# Patient Record
Sex: Female | Born: 1975 | Race: Black or African American | Hispanic: No | Marital: Married | Smoking: Never smoker
Health system: Southern US, Community
[De-identification: ages and names within clinical notes are randomized; demographics above are authoritative.]

## PROBLEM LIST (undated history)

## (undated) DIAGNOSIS — K0889 Other specified disorders of teeth and supporting structures: Secondary | ICD-10-CM

## (undated) DIAGNOSIS — I10 Essential (primary) hypertension: Secondary | ICD-10-CM

## (undated) DIAGNOSIS — D649 Anemia, unspecified: Secondary | ICD-10-CM

## (undated) DIAGNOSIS — Z8042 Family history of malignant neoplasm of prostate: Secondary | ICD-10-CM

## (undated) DIAGNOSIS — N390 Urinary tract infection, site not specified: Secondary | ICD-10-CM

## (undated) DIAGNOSIS — Z800 Family history of malignant neoplasm of digestive organs: Secondary | ICD-10-CM

## (undated) DIAGNOSIS — E785 Hyperlipidemia, unspecified: Secondary | ICD-10-CM

## (undated) HISTORY — PX: TUBAL LIGATION: SHX77

## (undated) HISTORY — DX: Hyperlipidemia, unspecified: E78.5

## (undated) HISTORY — DX: Essential (primary) hypertension: I10

## (undated) HISTORY — DX: Family history of malignant neoplasm of prostate: Z80.42

## (undated) HISTORY — DX: Anemia, unspecified: D64.9

## (undated) HISTORY — DX: Family history of malignant neoplasm of digestive organs: Z80.0

---

## 2004-12-04 ENCOUNTER — Ambulatory Visit (HOSPITAL_COMMUNITY): Admission: RE | Admit: 2004-12-04 | Discharge: 2004-12-04 | Payer: Self-pay | Admitting: *Deleted

## 2004-12-27 ENCOUNTER — Ambulatory Visit: Payer: Self-pay | Admitting: Obstetrics and Gynecology

## 2004-12-27 ENCOUNTER — Inpatient Hospital Stay (HOSPITAL_COMMUNITY): Admission: AD | Admit: 2004-12-27 | Discharge: 2004-12-27 | Payer: Self-pay | Admitting: Obstetrics & Gynecology

## 2005-01-21 ENCOUNTER — Ambulatory Visit (HOSPITAL_COMMUNITY): Admission: RE | Admit: 2005-01-21 | Discharge: 2005-01-21 | Payer: Self-pay | Admitting: *Deleted

## 2005-02-28 ENCOUNTER — Ambulatory Visit (HOSPITAL_COMMUNITY): Admission: RE | Admit: 2005-02-28 | Discharge: 2005-02-28 | Payer: Self-pay | Admitting: *Deleted

## 2005-03-28 ENCOUNTER — Inpatient Hospital Stay (HOSPITAL_COMMUNITY): Admission: RE | Admit: 2005-03-28 | Discharge: 2005-03-31 | Payer: Self-pay | Admitting: *Deleted

## 2005-03-28 ENCOUNTER — Encounter (INDEPENDENT_AMBULATORY_CARE_PROVIDER_SITE_OTHER): Payer: Self-pay | Admitting: Specialist

## 2005-03-28 ENCOUNTER — Ambulatory Visit: Payer: Self-pay | Admitting: *Deleted

## 2006-04-17 ENCOUNTER — Emergency Department (HOSPITAL_COMMUNITY): Admission: EM | Admit: 2006-04-17 | Discharge: 2006-04-17 | Payer: Self-pay | Admitting: Emergency Medicine

## 2006-10-07 ENCOUNTER — Emergency Department (HOSPITAL_COMMUNITY): Admission: EM | Admit: 2006-10-07 | Discharge: 2006-10-07 | Payer: Self-pay | Admitting: Emergency Medicine

## 2008-08-07 ENCOUNTER — Emergency Department (HOSPITAL_COMMUNITY): Admission: EM | Admit: 2008-08-07 | Discharge: 2008-08-07 | Payer: Self-pay | Admitting: Emergency Medicine

## 2009-01-15 ENCOUNTER — Emergency Department (HOSPITAL_COMMUNITY): Admission: EM | Admit: 2009-01-15 | Discharge: 2009-01-16 | Payer: Self-pay | Admitting: Emergency Medicine

## 2010-06-12 LAB — URINALYSIS, ROUTINE W REFLEX MICROSCOPIC
Bilirubin Urine: NEGATIVE
Glucose, UA: NEGATIVE mg/dL
Hgb urine dipstick: NEGATIVE
Ketones, ur: NEGATIVE mg/dL
Nitrite: NEGATIVE
Protein, ur: NEGATIVE mg/dL
Specific Gravity, Urine: 1.031 — ABNORMAL HIGH (ref 1.005–1.030)
Urobilinogen, UA: 1 mg/dL (ref 0.0–1.0)
pH: 6 (ref 5.0–8.0)

## 2010-06-12 LAB — POCT PREGNANCY, URINE: Preg Test, Ur: NEGATIVE

## 2010-07-26 NOTE — Discharge Summary (Signed)
NAME:  Jenna Yoder, Jenna Yoder           ACCOUNT NO.:  [id number]   MEDICAL RECORD NO.:  [id number]          PATIENT TYPE:  INP   LOCATION:  9124                          FACILITY:  [facility]   PHYSICIAN:  Angeline Slim, M.D.  DATE OF BIRTH:  Jan 01, 1976   DATE OF ADMISSION:  03/28/2005  DATE OF DISCHARGE:  03/31/2005                                 DISCHARGE SUMMARY   The patient's hospital medial team was the [facility] Teaching Service.   DISCHARGE DIAGNOSES:  1.  Repeat low-transverse C-section.  2.  Bilateral tubal ligation.  3.  Term pregnancy with history of three previous C-sections.   SUMMARY OF LABORATORY VALUES:  RPR nonreactive.  Hemoglobin was 10.1 on  admission and at discharge was 7.6.  Urinalysis was negative for protein.  Specific gravity 1.015, negative nitrites and a questionable trace leukocyte  esterase.   SUMMARY OF HOSPITAL COURSE:  This is a 35 year old female, who came in on  March 28, 2005 at 99 and 6 weeks of gestation with an unremarkable  prenatal course for a scheduled C-section and has a history of three  previous C-sections.  This was a repeat C-section.  Operation performed by  Dr. Mayford Knife and Dr. Irving Burton under supervision of Dr. Gavin Potters.  Please see  their operation note for full details of C-section.   Postoperatively, the patient did well.  Desired that her female child get a  circumcision, which he did.  Gradually increased diet, increased ambulation.  The patient was rubella nonimmune and has gotten a rubella booster prior to  discharge.  The patient was significantly anemic with a hemoglobin of 7.4  postoperatively; however, was not symptomatic, and will start iron  supplementation before discharge and to continue at home, and patient was  feeding infant by breast and bottle feeding both.   Follow up care will be with [facility] in approximately six weeks.   DISCHARGE MEDICATIONS:  1.  Colace 100 mg b.i.d.  2.  Percocet 5/325 mg one tablet to be  taken q.4h. p.r.n. breakthrough pain.  3.  Ibuprofen 600 mg p.o. q.6h. p.r.n. pain.  4.  Ferrous sulfate 325 mg b.i.d. and between meals p.o.   DISCHARGE ACTIVITY:  Increase as tolerated.   DIET:  Increase as tolerated.   The patient is discharge to home tolerating diet, passing gas and able to  ambulate without difficulty.      Angeline Slim, M.D.     AL/MEDQ  D:  03/31/2005  T:  03/31/2005  Job:  [id number]

## 2010-07-26 NOTE — Op Note (Signed)
Jenna Yoder, Jenna Yoder           ACCOUNT NO.:  [id number]   MEDICAL RECORD NO.:  [id number]          PATIENT TYPE:  INP   LOCATION:  9124                          [facility]   PHYSICIAN:  Conni Elliot, M.D.DATE OF BIRTH:  03/26/1975   DATE OF PROCEDURE:  03/31/2005  DATE OF DISCHARGE:                                 OPERATIVE REPORT   PREOPERATIVE DIAGNOSES:  1.  Intrauterine pregnancy at 38 weeks and 6 days.  2.  History of three previous cesarean sections.  3.  Desires permanent sterilization.   POSTOPERATIVE DIAGNOSES:  1.  Intrauterine pregnancy at 38 weeks and 6 days.  2.  History of three previous cesarean sections.  3.  Desires permanent sterilization.   OPERATION/PROCEDURE:  1.  Repeat low transverse cesarean section via Pfannenstiel.  2.  Bilateral tubal ligation.   SURGEON:  Conni Elliot, M.D.   ASSISTANT:  1.  Tracy L. Mayford Knife, M.D.  2.  Angeline Slim, M.D.   ANESTHESIA:  Spinal.   COMPLICATIONS:  None.   ESTIMATED BLOOD LOSS:  800.   URINARY OUTPUT:  Clear urine at the end of the procedure.   INDICATIONS:  The patient is a 35 year old G4, para 3-0-0-3 at 38 weeks and  6 days who presents for scheduled repeat cesarean section and bilateral  tubal ligation.  The patient was counseled on the risks and benefits and  wanted to proceed.   FINDINGS:  Female infant in the cephalic presentation.  Apgars 9 and 9.  Normal uterus, tubes and ovaries except there was a significant amount of  scarring.  Her uterus was rotated to the left.   DESCRIPTION OF PROCEDURE:  The patient was taken to the operating room where  spinal anesthesia was placed and found to be adequate.  She was then prepped  and draped in the normal sterile fashion in the dorsal supine position with  a leftward tilt.  A Pfannenstiel skin incision was then made with the  scalpel and carried through to the underlying layer of fascia.  The fascia  was incised nicked in the midline and  the incision extended laterally with  the Mayo scissors.  The superior aspect of the fascial incision was then  grasped with the Kocher clamps, elevated and the underlying muscles  dissected off bluntly using the Mayo scissors the Mayo scissors and the  Bovie.  There was a significant amount of scarring.  Attention was then  turned to the inferior aspect of the incision which in a similar fashion was  grasped, tented up with Kocher clamps and the rectus muscle dissected off  bluntly.  The rectus muscles were separated in the midline and the  peritoneum identified and entered bluntly.  The peritoneal incision was  extended superiorly and inferiorly.  Once again there was significant amount  of scarring. This was done with great care.  The rectus muscles were incised  using the Bovie to make additional room.  There was significant scarring  within the peritoneal cavity.  The uterus was rotated to the left.  The  vesicouterine peritoneum was identified, grasped with the pickups and  entered sharply with the Metzenbaum scissors.  The incision was extended  laterally and the bladder flap created digitally.  The bladder blade was  then reinserted and the lower uterine segment incised in the transverse  fashion with the scalpel.  The uterine incision was extended laterally with  bandage scissors.  The bladder blade was removed and the infant's head  delivered atraumatically.  The nose and mouth were suctioned with bulb  suctioned and the cord clamped and cut.  The infant was handed off to the  waiting neonatologist.  The placenta was removed manually.  The uterus was  not exteriorized, but it was cleared of all clots and debris.  The uterine  incision was repaired with #1 chromic in a running locked fashion.  A second  layer of the same suture was used to imbricate.   Next, attention was turned her fallopian tubes.  There was significant  amount of scarring especially on the right side, but the  right fallopian  tube was grasped with a Babcock and followed down to the fimbria.  Then  suture on an S-H needle was placed through the mesosalpinx and a portion of  the fallopian tube was ligated.  Next, the same suture was used to ligate  the fallopian tube just inferior to where it had been tied.  The knot  section of the right fallopian tube was excised.  Excellent hemostasis was  obtained and it was returned to the peritoneal cavity.  Attention was then  turned to the left side and the same thing was repeated.   Next, attention was then turned back to the uterine incision.  There was a  small amount of oozing so an area on the left side was oversewn with  chromic.  Finally excellent hemostasis was obtained.  The fascia was  reapproximated with 0 Vicryl in a running fashion.  The subcuticular layer  was also closed and finally the skin was closed with staples.   The patient tolerated the procedure well.  Sponge, lap and needle counts  were correct x2.  Ancef was given at cord clamp.  The patient was taken to  the recovery room in stable condition.     ______________________________  Marc Morgans Mayford Knife, M.D.    ______________________________  Conni Elliot, M.D.    TLW/MEDQ  D:  03/30/2005  T:  03/31/2005  Job:  [id number]

## 2010-09-01 ENCOUNTER — Emergency Department (HOSPITAL_COMMUNITY): Payer: Self-pay

## 2010-09-01 ENCOUNTER — Emergency Department (HOSPITAL_COMMUNITY)
Admission: EM | Admit: 2010-09-01 | Discharge: 2010-09-01 | Disposition: A | Discharge status: Home / Self Care | Payer: Self-pay | Attending: Emergency Medicine | Admitting: Emergency Medicine

## 2010-09-01 DIAGNOSIS — M25519 Pain in unspecified shoulder: Secondary | ICD-10-CM | POA: Insufficient documentation

## 2010-09-01 DIAGNOSIS — W108XXA Fall (on) (from) other stairs and steps, initial encounter: Secondary | ICD-10-CM | POA: Insufficient documentation

## 2010-09-01 DIAGNOSIS — S4350XA Sprain of unspecified acromioclavicular joint, initial encounter: Secondary | ICD-10-CM | POA: Insufficient documentation

## 2012-07-26 ENCOUNTER — Telehealth: Payer: Self-pay | Admitting: *Deleted

## 2012-07-26 NOTE — Telephone Encounter (Signed)
Patient was seen 03-22-12 (see pre EPIC chart) and PUS was ordered for enlarged uterus and DUB.  Patient then had abnormal pap and colpo was ordered.  Patient [facility] for colpo 05-05-12.  Multiple calls to patient (previous chart).  Calling as follow up before sending 30 day letter. LMTCB on cell number in old chart [phone] and work # [phone]. Home # in chart [phone] is out of service.

## 2012-09-02 NOTE — Telephone Encounter (Signed)
Patient has not returned multiple attempts to contact her for PUS and colpo. What type of letter do you want her to receive?

## 2012-09-03 NOTE — Telephone Encounter (Signed)
30 day f/u necessary or discharge letter

## 2012-09-06 ENCOUNTER — Telehealth: Payer: Self-pay | Admitting: Obstetrics and Gynecology

## 2012-09-06 DIAGNOSIS — N852 Hypertrophy of uterus: Secondary | ICD-10-CM

## 2012-09-06 DIAGNOSIS — R87810 Cervical high risk human papillomavirus (HPV) DNA test positive: Secondary | ICD-10-CM

## 2012-09-06 DIAGNOSIS — N938 Other specified abnormal uterine and vaginal bleeding: Secondary | ICD-10-CM

## 2012-09-06 NOTE — Telephone Encounter (Signed)
6/30 called cell # (she gave it as the preferred contact number) was unable to leave a message because her mailbox was full. Also tried the number listed as a home phone number, it rang several times then the call was immediately disconnected.   **Calls made by [employer].

## 2012-09-06 NOTE — Telephone Encounter (Signed)
Attempt to call patient Needs both colpo and PUS.  Only scheduled for PUS.  VM  Confirms "This is Jenna Yoder"  But unable to leave message due to voice mailbox full.

## 2012-09-06 NOTE — Telephone Encounter (Signed)
Patient states she is returning Sally's call re: ultrasound scheduling. No note in EPIC so routing to triage.

## 2012-09-06 NOTE — Telephone Encounter (Signed)
Patient called to schedule ultrasound appointment.  Appt scheduled for this Wednesday at 1230. Patient asking about OOP cost and I told her I would need to have someone in billing pick up call.  Patient hung up before they were able to pick up call. Carolynn  In billing/scheduling will call her back and also remind her about need to schedule colpo as well.

## 2012-09-07 NOTE — Telephone Encounter (Signed)
At this point, she is scheduled for her PUS tomm at 1230 and consult at 1pm.  She has not returned our calls regarding the need to schedule her colpo and her OOP costs. If she comes in for appt, she has a significant balance due.  Due to her non-compliance, a 30 day letter was written on 09-03-12 prior to her call but not mailed on 09-06-12  since she scheduled the appointment.

## 2012-09-07 NOTE — Telephone Encounter (Signed)
Letter written and to MD for signature, patient called in on 09-06-12 prior to letter being mailed and appt scheduled for 09-08-12.  See next telephone note.

## 2012-09-07 NOTE — Telephone Encounter (Signed)
I am not clear if the patient is coming in for her ultrasound tomorrow.  I will not be in the office all morning.  [employer]

## 2012-09-08 ENCOUNTER — Ambulatory Visit: Payer: Self-pay | Admitting: Obstetrics and Gynecology

## 2012-09-08 ENCOUNTER — Other Ambulatory Visit: Payer: Self-pay

## 2012-09-08 NOTE — Telephone Encounter (Signed)
Pt cancelled ultrasound appt for today because she can't get out of class. Would like nurse to call  And reschedule appt.

## 2012-09-08 NOTE — Telephone Encounter (Signed)
Jenna Yoder,  This message came through regarding this patient.  Conley Simmonds, MD

## 2012-09-23 NOTE — Telephone Encounter (Signed)
Call to patient to follow up on canceled appointments and 30 day letter.  VM confirms patient's first name. LMTCB.

## 2012-10-11 NOTE — Telephone Encounter (Signed)
30 day letter was mailed on 09-07-12. Patient had appointment on 09-08-12 which she canceled due to work and has not rescheduled or returned my call from 09-23-12.  Discharged from practice.

## 2014-03-05 ENCOUNTER — Encounter (HOSPITAL_COMMUNITY): Payer: Self-pay | Admitting: Emergency Medicine

## 2014-03-05 ENCOUNTER — Emergency Department (HOSPITAL_COMMUNITY)
Admission: EM | Admit: 2014-03-05 | Discharge: 2014-03-05 | Disposition: A | Discharge status: Home / Self Care | Payer: BC Managed Care – PPO | Attending: Emergency Medicine | Admitting: Emergency Medicine

## 2014-03-05 DIAGNOSIS — K088 Other specified disorders of teeth and supporting structures: Secondary | ICD-10-CM | POA: Insufficient documentation

## 2014-03-05 DIAGNOSIS — R220 Localized swelling, mass and lump, head: Secondary | ICD-10-CM

## 2014-03-05 DIAGNOSIS — K029 Dental caries, unspecified: Secondary | ICD-10-CM

## 2014-03-05 DIAGNOSIS — K0889 Other specified disorders of teeth and supporting structures: Secondary | ICD-10-CM

## 2014-03-05 MED ORDER — OXYCODONE-ACETAMINOPHEN 5-325 MG PO TABS
2.0000 | ORAL_TABLET | Freq: Once | ORAL | Status: AC
  Administered 2014-03-05: 2 via ORAL
  Filled 2014-03-05: qty 2

## 2014-03-05 MED ORDER — ONDANSETRON 4 MG PO TBDP
4.0000 mg | ORAL_TABLET | Freq: Once | ORAL | Status: AC
  Administered 2014-03-05: 4 mg via ORAL
  Filled 2014-03-05: qty 1

## 2014-03-05 MED ORDER — AMOXICILLIN 500 MG PO CAPS: 500.0000 mg | ORAL_CAPSULE | Freq: Three times a day (TID) | ORAL | Status: DC

## 2014-03-05 MED ORDER — OXYCODONE-ACETAMINOPHEN 5-325 MG PO TABS: 1.0000 | ORAL_TABLET | Freq: Four times a day (QID) | ORAL | Status: DC | PRN

## 2014-03-05 NOTE — ED Notes (Signed)
pts vital signs updated. Pt awaiting discharge paperwork at bedside.  

## 2014-03-05 NOTE — ED Provider Notes (Signed)
CSN: [id number]     Arrival date & time 03/05/14  1903 History   First MD Initiated Contact with Patient 03/05/14 2128     Chief Complaint  Patient presents with  . Dental Pain    The patient has swelling and redness to her left, lower cheek.  She says she has had an abscess on the same side and she got antibiotics and it went away.     (Consider location/radiation/quality/duration/timing/severity/associated sxs/prior Treatment) Patient is a 38 y.o. female presenting with tooth pain. The history is provided by the patient and medical records. No language interpreter was used.  Dental Pain Location:  Lower Lower teeth location:  17/LL 3rd molar Quality:  Aching and constant Severity:  Moderate Onset quality:  Gradual Duration:  3 days Timing:  Constant Progression:  Worsening Chronicity:  Recurrent Associated symptoms: no drooling and no fever   denies difficulty breathing or swallowing.    History reviewed. No pertinent past medical history. Past Surgical History  Procedure Laterality Date  . Cesarean section     History reviewed. No pertinent family history. History  Substance Use Topics  . Smoking status: Never Smoker   . Smokeless tobacco: Never Used  . Alcohol Use: Yes     Comment: occ   OB History    No data available     Review of Systems  Constitutional: Negative for fever and chills.  HENT: Positive for dental problem. Negative for drooling and trouble swallowing.   Respiratory: Negative for shortness of breath and stridor.   Gastrointestinal: Negative for nausea and vomiting.  Musculoskeletal: Negative for myalgias.      Allergies  Review of patient's allergies indicates no known allergies.  Home Medications   Prior to Admission medications   Medication Sig Start Date End Date Taking? Authorizing Provider  amoxicillin (AMOXIL) 500 MG capsule Take 1 capsule (500 mg total) by mouth 3 (three) times daily. 03/05/14   Arthor CaptainAbigail Dashon Mcintire, PA-C    oxyCODONE-acetaminophen (PERCOCET/ROXICET) 5-325 MG per tablet Take 1-2 tablets by mouth every 6 (six) hours as needed for severe pain. 03/05/14   Iesha Summerhill, PA-C   BP 130/84 mmHg  Pulse 72  Temp(Src) 97.7 F (36.5 C) (Oral)  Resp 18  Ht 5\' 7"  (1.702 m)  Wt 230 lb (104.327 kg)  BMI 36.01 kg/m2  SpO2 100%  LMP 02/16/2014 Physical Exam  Constitutional: She is oriented to person, place, and time. She appears well-developed and well-nourished. No distress.  HENT:  Head: Normocephalic and atraumatic.  Mouth/Throat: Uvula is midline and oropharynx is clear and moist. Dental caries present. No dental abscesses.    Eyes: Conjunctivae are normal. No scleral icterus.  Neck: Normal range of motion.  Cardiovascular: Normal rate, regular rhythm and normal heart sounds.  Exam reveals no gallop and no friction rub.   No murmur heard. Pulmonary/Chest: Effort normal and breath sounds normal. No respiratory distress.  Abdominal: Soft. Bowel sounds are normal. She exhibits no distension and no mass. There is no tenderness. There is no guarding.  Neurological: She is alert and oriented to person, place, and time.  Skin: Skin is warm and dry. She is not diaphoretic.    ED Course  Procedures (including critical care time) Labs Review Labs Reviewed - No data to display  Imaging Review No results found.   EKG Interpretation None      MDM   Final diagnoses:  Dentalgia  Facial swelling  Dental caries    Patient with toothache.  No gross  abscess.  Exam unconcerning for Ludwig's angina or spread of infection.  Will treat with penicillin and pain medicine.  Urged patient to follow-up with dentist.     Arthor CaptainAbigail Olukemi Panchal, PA-C 03/05/14 2221  Ethelda ChickMartha K Linker, MD 03/05/14 2224

## 2014-03-05 NOTE — ED Notes (Signed)
The patient has swelling and redness to her left, lower cheek.  She says she has had an abscess on the same side and she got antibiotics and it went away.  She said it started hurting her yesterday and she noticed it was swollen and red.  She rates her pain 8/10.

## 2014-03-05 NOTE — Discharge Instructions (Signed)
You have been diagnosed with Dental pain. Please call the follow up dentist first thing in the morning on Monday for a follow up appointment. Keep your discharge paperwork from today's visit to bring to the dentist office. You may also use the resource guide listed below to help you find a dentist if you do not already have one to followup with. It is very important that you get evaluated by a dentist as soon as possible.  Use your pain medication as prescribed and do not operate heavy machinery while on pain medication. Note that your pain medication contains acetaminophen (Tylenol) & its is not reccommended that you use additional acetaminophen (Tylenol) while taking this medication. Take your full course of antibiotics. Read the instructions below. ° °Eat a soft or liquid diet and rinse your mouth out after meals with warm water. You should see a dentist or return here at once if you have increased swelling, increased pain or uncontrolled bleeding from the site of your injury. ° ° °SEEK MEDICAL CARE IF:  °· You have increased pain not controlled with medicines.  °· You have swelling around your tooth, in your face or neck.  °· You have bleeding which starts, continues, or gets worse.  °· You have a fever >101 °· If you are unable to open your mouth °Soft Diet  °The soft diet may be recommended after you were put on a full liquid diet. A normal diet may follow. The soft diet can also be used after surgery if you are too ill to keep down a normal diet. The soft diet may also be needed if you have a hard time chewing foods.  °DESCRIPTION  °Tender foods are used. Foods do not need to be ground or pureed. Most raw fruits and vegetables and coarse breads and cereals should be avoided. Fried foods and highly seasoned foods may cause discomfort.  °NUTRITIONAL ADEQUACY  °A healthy diet is possible if foods from each of the basic food groups are eaten daily.  °SOFT DIET FOOD LISTS  °Milk/Dairy  °Allowed: Milk and milk  drinks, milk shakes, cream cheese, cottage cheese, mild cheeses.  °Avoid: Sharp or highly seasoned cheese. °Meat/Meat Substitutes  °Allowed: Broiled, roasted, baked, or stewed tender lean beef, mutton, lamb, veal, chicken, turkey, liver, ham, crisp bacon, white fish, tuna, salmon. Eggs, smooth peanut butter.  °Avoid: All fried meats, fish, or fowl. Rich gravies and sauces. Lunch meats, sausages, hot dogs. Meats with gristle, chunky peanut butter. °Breads/Grains  °Allowed: Rice, noodles, spaghetti, macaroni. Dry or cooked refined cereals, such as farina, cream of wheat, oatmeal, grits, whole-wheat cereals. Plain or toasted white or wheat blend or whole-grain breads, soda crackers or saltines, flour tortillas.  °Avoid: Wild rice, coarse cereals, such as bran. Seed in or on breads and crackers. Bread or bread products with nuts or seeds. °Fruits/Vegetables  °Allowed: Fruit and vegetable juices, well-cooked or canned fruits and vegetables, any dried fruit. One citrus fruit daily, 1 vitamin A source daily. Well-ripened, easy to chew fruits, sweet potatoes. Baked, boiled, mashed, creamed, scalloped, or au gratin potatoes. Broths or creamed soups made with allowed vegetables, strained tomatoes.  °Avoid: All gas-forming vegetables (corn, radishes, Brussels sprouts, onions, broccoli, cabbage, parsnips, turnips, chili peppers, pinto beans, split peas, dried beans). Fruits containing seeds and skin. Potato chips and corn chips. All others that are not made with allowed vegetables. Highly seasoned soups. °Desserts/Sweets  °Allowed: Simple desserts, such as custard, junkets, gelatin desserts, plain ice cream and sherbets, simple cakes   and cookies, allowed fruits, sugar, syrup, jelly, honey, plain hard candy, and molasses.  °Avoid: Rich pastries, any dessert containing dates, nuts, raisins, or coconut. Fried pastries, such as doughnuts. Chocolate. °Beverages  °Allowed: Fruit and vegetable juices. Caffeine-free carbonated drinks,  coffee, and tea.  °Avoid: Caffeinated beverages: coffee, tea, soda or pop. °Miscellaneous  °Allowed: Butter, cream, margarine, mayonnaise, oil. Cream sauces, salt, and mild spices.  °Avoid: Highly spiced salad dressings. Highly seasoned foods, hot sauce, mustard, horseradish, and pepper. °SAMPLE MENU  °Breakfast  °Orange juice.  °Oatmeal.  °Soft cooked egg.  °Toast and margarine.  °2% milk.  °Coffee. °Lunch  °Meatloaf.  °Mashed potato.  °Green beans.  °Lemon pudding.  °Bread and margarine.  °Coffee. °Dinner  °Consommé or apricot nectar.  °Chicken breast.  °Rice, peas, and carrots.  °Applesauce.  °Bread and margarine.  °2% milk. °To cut the amount of fat in your diet, omit margarine and use 1% or skim milk.  °NUTRIENT ANALYSIS  °Calories........................1953 Kcal.  °Protein.........................102 gm.  °Carbohydrate...............247 gm.  °Fat................................65 gm.  °Cholesterol...................449 mg.  °Dietary fiber.................19 gm.  °Vitamin A.....................2944 RE.  °Vitamin C.....................79 mg.  °Niacin..........................25 mg.  °Riboflavin....................2.0 mg.  °Thiamin.......................1.5 mg.  °Folate..........................249 mcg.  °Calcium.......................1030 mg.  °Phosphorus.................1782 mg.  °Zinc..............................12 mg.  °Iron..............................13 mg.  °Sodium.........................299 mg.  °Potassium....................3046 mg. °Document Released: 06/03/2007 Document Revised: 05/19/2011 Document Reviewed: 06/03/2007  °ExitCare® Patient Information ©2014 [employer].  ° °RESOURCE GUIDE ° ° °Dental Problems ° °Dr. Janna Civilis °$200 dollar visit °[address] °[place], [place] [address]  °[phone] °  ° °Patients with Medicaid: °[facility]                     [facility] °[address]                                           [address] °Phone:   [phone]                                                  Phone:  [phone] ° °If unable to pay or uninsured, contact:  [facility] or [facility] to become qualified for the adult dental clinic. ° °Chronic Pain Problems °Contact [facility]  [phone] °Patients need to be referred by their primary care doctor. ° °Insufficient Money for Medicine °Contact [employer]:  call "211" or [facility] 271-5999. ° °No Primary Care Doctor °Call [facility]  832-8000 °Other agencies that provide inexpensive medical care °   [facility]  [phone] °   [facility]  [phone] °   [facility]  [phone] °   [facility]  [phone] °   [facility]  [phone] °   [facility]  [phone] ° °Psychological Services °[facility]  [phone] °[facility]  [phone] °[facility]   [phone] (emergency services [phone]) ° °Substance Abuse Resources °[facility]  336-882-2125 °[facility] 336-784-9470 °[facility] 336-285-9073 °[facility] 336-845-3988 °Residential & Outpatient Substance Abuse Program  800-659-3381 ° °Abuse/Neglect °[place] [facility] Hotline (336) 641-3795 °[place] [facility] Hotline 800-378-5315 (After Hours) ° °Emergency Shelter °[facility] (336) 271-5985 ° °Maternity Homes °[facility] (336) 275-9566 °[facility] (704) 372-4663 ° °MRSA Hotline #:   832-7006 ° ° ° °[place] Resources ° °[facility] [place] [facility]     [employer]                          [facility] °[address] [place]                       [address]      [address]  °[place]                                                [place]                            [place] °Phone:  349-3220                                   Phone:  [phone]                 Phone:   342-8140 ° °[facility] °Phone:  342-8316 ° °[place] Child Abuse Hotline °(336) 342-1394 °(336) 342-3537 (After Hours) ° ° ° ° ° ° °

## 2014-10-12 ENCOUNTER — Emergency Department (HOSPITAL_COMMUNITY)
Admission: EM | Admit: 2014-10-12 | Discharge: 2014-10-12 | Disposition: A | Discharge status: Home / Self Care | Payer: BLUE CROSS/BLUE SHIELD | Attending: Emergency Medicine | Admitting: Emergency Medicine

## 2014-10-12 ENCOUNTER — Encounter (HOSPITAL_COMMUNITY): Payer: Self-pay | Admitting: Emergency Medicine

## 2014-10-12 DIAGNOSIS — Z8719 Personal history of other diseases of the digestive system: Secondary | ICD-10-CM | POA: Insufficient documentation

## 2014-10-12 DIAGNOSIS — Z9851 Tubal ligation status: Secondary | ICD-10-CM | POA: Diagnosis not present

## 2014-10-12 DIAGNOSIS — N3090 Cystitis, unspecified without hematuria: Secondary | ICD-10-CM | POA: Diagnosis not present

## 2014-10-12 DIAGNOSIS — Z792 Long term (current) use of antibiotics: Secondary | ICD-10-CM | POA: Diagnosis not present

## 2014-10-12 DIAGNOSIS — R300 Dysuria: Secondary | ICD-10-CM | POA: Diagnosis present

## 2014-10-12 HISTORY — DX: Other specified disorders of teeth and supporting structures: K08.89

## 2014-10-12 LAB — URINALYSIS, ROUTINE W REFLEX MICROSCOPIC
Bilirubin Urine: NEGATIVE
Glucose, UA: NEGATIVE mg/dL
Ketones, ur: NEGATIVE mg/dL
NITRITE: NEGATIVE
Protein, ur: 30 mg/dL — AB
Specific Gravity, Urine: 1.022 (ref 1.005–1.030)
Urobilinogen, UA: 1 mg/dL (ref 0.0–1.0)
pH: 6 (ref 5.0–8.0)

## 2014-10-12 LAB — URINE MICROSCOPIC-ADD ON

## 2014-10-12 MED ORDER — CEPHALEXIN 500 MG PO CAPS: 500.0000 mg | ORAL_CAPSULE | Freq: Two times a day (BID) | ORAL | Status: DC

## 2014-10-12 MED ORDER — PHENAZOPYRIDINE HCL 200 MG PO TABS: 200.0000 mg | ORAL_TABLET | Freq: Three times a day (TID) | ORAL | Status: DC

## 2014-10-12 MED ORDER — CEPHALEXIN 250 MG PO CAPS
500.0000 mg | ORAL_CAPSULE | Freq: Once | ORAL | Status: AC
  Administered 2014-10-12: 500 mg via ORAL
  Filled 2014-10-12: qty 2

## 2014-10-12 NOTE — ED Notes (Signed)
Pt. reports UTI symptoms -  dysuria and concentrated urine onset this morning , denies fever or chills.

## 2014-10-12 NOTE — Discharge Instructions (Signed)

## 2014-10-12 NOTE — ED Provider Notes (Signed)
CSN: [id number]     Arrival date & time 10/12/14  2106 History  This chart was scribed for Danelle Berry, PA-C, working with Laurence Spates, MD by Chestine Spore, ED Scribe. The patient was seen in room TR09C/TR09C at 9:55 PM.    Chief Complaint  Patient presents with  . Dysuria      The history is provided by the patient. No language interpreter was used.    HPI Comments: Jenna Yoder is a 39 y.o. female who presents to the Emergency Department complaining of worsening dysuria onset this morning. Pt reports that she thinks that she has a UTI and that she has never had one in the past. She states that she is having associated symptoms of frequency and hematuria. She denies vaginal discharge/malodorous odor, abdominal pain, back pain, fever, n/v, dyspareunia, vaginal swelling, and any other symptoms. Patient's last menstrual period was 09/18/2014. Pt has had a tubal ligation.    Past Medical History  Diagnosis Date  . Dentalgia    Past Surgical History  Procedure Laterality Date  . Cesarean section     No family history on file. History  Substance Use Topics  . Smoking status: Never Smoker   . Smokeless tobacco: Never Used  . Alcohol Use: Yes     Comment: occ   OB History    No data available     Review of Systems  Constitutional: Negative for fever.  Gastrointestinal: Negative for nausea, vomiting and abdominal pain.  Genitourinary: Positive for dysuria and hematuria. Negative for vaginal bleeding, vaginal discharge and vaginal pain.  Musculoskeletal: Negative for back pain.      Allergies  Review of patient's allergies indicates no known allergies.  Home Medications   Prior to Admission medications   Medication Sig Start Date End Date Taking? Authorizing Provider  amoxicillin (AMOXIL) 500 MG capsule Take 1 capsule (500 mg total) by mouth 3 (three) times daily. 03/05/14   Arthor Captain, PA-C  oxyCODONE-acetaminophen (PERCOCET/ROXICET) 5-325 MG per tablet  Take 1-2 tablets by mouth every 6 (six) hours as needed for severe pain. 03/05/14   Abigail Harris, PA-C   BP 139/88 mmHg  Pulse 70  Temp(Src) 97.7 F (36.5 C) (Oral)  Resp 22  Wt 235 lb (106.595 kg)  SpO2 99%  LMP 09/18/2014 Physical Exam  Constitutional: She is oriented to person, place, and time. Vital signs are normal. She appears well-developed and well-nourished. She is cooperative. She does not appear ill. No distress.  HENT:  Head: Normocephalic and atraumatic.  Nose: Nose normal.  Mouth/Throat: Oropharynx is clear and moist. No oropharyngeal exudate.  Eyes: Conjunctivae and EOM are normal. Pupils are equal, round, and reactive to light. Right eye exhibits no discharge. Left eye exhibits no discharge. No scleral icterus.  Neck: Normal range of motion. No JVD present. No tracheal deviation present. No thyromegaly present.  Cardiovascular: Normal rate, regular rhythm, normal heart sounds and intact distal pulses.  Exam reveals no gallop and no friction rub.   No murmur heard. Pulmonary/Chest: Effort normal and breath sounds normal. No respiratory distress. She has no wheezes. She has no rales. She exhibits no tenderness.  Abdominal: Soft. Bowel sounds are normal. She exhibits no distension and no mass. There is tenderness. There is no rebound and no guarding.  Mild suprapubic tenderness, no CVA tenderness  Musculoskeletal: Normal range of motion. She exhibits no edema or tenderness.  Lymphadenopathy:    She has no cervical adenopathy.  Neurological: She is alert and oriented  to person, place, and time. She has normal reflexes. No cranial nerve deficit. She exhibits normal muscle tone. Coordination normal.  Skin: Skin is warm and dry. No rash noted. She is not diaphoretic. No erythema. No pallor.  Psychiatric: She has a normal mood and affect. Her behavior is normal. Judgment and thought content normal.  Nursing note and vitals reviewed.   ED Course  Procedures (including  critical care time) DIAGNOSTIC STUDIES: Oxygen Saturation is 99% on RA, nl by my interpretation.    COORDINATION OF CARE: 9:59 PM-Discussed treatment plan which includes UA and pelvic exam with pt at bedside and pt agreed to plan.   Labs Review Labs Reviewed  URINALYSIS, ROUTINE W REFLEX MICROSCOPIC (NOT AT [facility]) - Abnormal; Notable for the following:    APPearance CLOUDY (*)    Hgb urine dipstick LARGE (*)    Protein, ur 30 (*)    Leukocytes, UA LARGE (*)    All other components within normal limits  URINE MICROSCOPIC-ADD ON - Abnormal; Notable for the following:    Bacteria, UA FEW (*)    All other components within normal limits    Imaging Review No results found.   EKG Interpretation None      MDM   Final diagnoses:  None    Patient with 1 day of dysuria and hematuria, denies any and all vaginal symptoms, patient vitals are stable, no tachycardia is afebrile, she has had no abdominal pain, no nausea no vomiting no fever and no flank pain. Urinalysis is consistent with UTI. Will treat with Keflex and Pyridium  First dose of Keflex was given here in the ER with successful PO challenge Patient vitals were reviewed and she is discharged home in satisfactory condition  Medications  cephALEXin (KEFLEX) capsule 500 mg (500 mg Oral Given 10/12/14 2235)     I personally performed the services described in this documentation, which was scribed in my presence. The recorded information has been reviewed and is accurate.    Danelle Berry, PA-C 10/20/14 0145  Laurence Spates, MD 10/21/14 [phone]

## 2015-02-07 ENCOUNTER — Emergency Department (HOSPITAL_COMMUNITY)
Admission: EM | Admit: 2015-02-07 | Discharge: 2015-02-07 | Disposition: A | Discharge status: Home / Self Care | Payer: BLUE CROSS/BLUE SHIELD | Attending: Emergency Medicine | Admitting: Emergency Medicine

## 2015-02-07 ENCOUNTER — Encounter (HOSPITAL_COMMUNITY): Payer: Self-pay | Admitting: Emergency Medicine

## 2015-02-07 DIAGNOSIS — Z3202 Encounter for pregnancy test, result negative: Secondary | ICD-10-CM | POA: Diagnosis not present

## 2015-02-07 DIAGNOSIS — N390 Urinary tract infection, site not specified: Secondary | ICD-10-CM

## 2015-02-07 DIAGNOSIS — Z792 Long term (current) use of antibiotics: Secondary | ICD-10-CM | POA: Insufficient documentation

## 2015-02-07 HISTORY — DX: Urinary tract infection, site not specified: N39.0

## 2015-02-07 LAB — CBC WITH DIFFERENTIAL/PLATELET
BASOS ABS: 0 10*3/uL (ref 0.0–0.1)
Basophils Relative: 1 %
Eosinophils Absolute: 0.1 10*3/uL (ref 0.0–0.7)
Eosinophils Relative: 1 %
HCT: 37.5 % (ref 36.0–46.0)
HEMOGLOBIN: 11.5 g/dL — AB (ref 12.0–15.0)
Lymphocytes Relative: 43 %
Lymphs Abs: 2.7 10*3/uL (ref 0.7–4.0)
MCH: 25.8 pg — ABNORMAL LOW (ref 26.0–34.0)
MCHC: 30.7 g/dL (ref 30.0–36.0)
MCV: 84.3 fL (ref 78.0–100.0)
MONOS PCT: 5 %
Monocytes Absolute: 0.3 10*3/uL (ref 0.1–1.0)
Neutro Abs: 3.2 10*3/uL (ref 1.7–7.7)
Neutrophils Relative %: 50 %
Platelets: 262 10*3/uL (ref 150–400)
RBC: 4.45 MIL/uL (ref 3.87–5.11)
RDW: 16.4 % — ABNORMAL HIGH (ref 11.5–15.5)
WBC: 6.3 10*3/uL (ref 4.0–10.5)

## 2015-02-07 LAB — URINALYSIS, ROUTINE W REFLEX MICROSCOPIC
Glucose, UA: NEGATIVE mg/dL
Ketones, ur: 40 mg/dL — AB
Nitrite: POSITIVE — AB
Protein, ur: 30 mg/dL — AB
Specific Gravity, Urine: 1.018 (ref 1.005–1.030)
pH: 5 (ref 5.0–8.0)

## 2015-02-07 LAB — BASIC METABOLIC PANEL
Anion gap: 5 (ref 5–15)
BUN: 8 mg/dL (ref 6–20)
CO2: 28 mmol/L (ref 22–32)
Calcium: 9.7 mg/dL (ref 8.9–10.3)
Chloride: 105 mmol/L (ref 101–111)
Creatinine, Ser: 0.74 mg/dL (ref 0.44–1.00)
GFR calc non Af Amer: >60 mL/min (ref >60)
Glucose, Bld: 114 mg/dL — ABNORMAL HIGH (ref 65–99)
Potassium: 4.1 mmol/L (ref 3.5–5.1)
Sodium: 138 mmol/L (ref 135–145)

## 2015-02-07 LAB — URINE MICROSCOPIC-ADD ON

## 2015-02-07 LAB — POC URINE PREG, ED: PREG TEST UR: NEGATIVE

## 2015-02-07 MED ORDER — PHENAZOPYRIDINE HCL 100 MG PO TABS
95.0000 mg | ORAL_TABLET | Freq: Once | ORAL | Status: AC
  Administered 2015-02-07: 100 mg via ORAL
  Filled 2015-02-07: qty 1

## 2015-02-07 MED ORDER — SULFAMETHOXAZOLE-TRIMETHOPRIM 800-160 MG PO TABS
1.0000 | ORAL_TABLET | Freq: Once | ORAL | Status: AC
  Administered 2015-02-07: 1 via ORAL
  Filled 2015-02-07: qty 1

## 2015-02-07 MED ORDER — PHENAZOPYRIDINE HCL 95 MG PO TABS: 95.0000 mg | ORAL_TABLET | Freq: Three times a day (TID) | ORAL | Status: DC

## 2015-02-07 MED ORDER — SULFAMETHOXAZOLE-TRIMETHOPRIM 800-160 MG PO TABS: 1.0000 | ORAL_TABLET | Freq: Two times a day (BID) | ORAL | Status: DC

## 2015-02-07 NOTE — ED Notes (Signed)
Pt. reports dysuria , hematuria , bladder pressure and urinary frequency onset this week , denies fever or chills.

## 2015-02-07 NOTE — ED Provider Notes (Signed)
CSN: [id number]     Arrival date & time 02/07/15  1955 History   First MD Initiated Contact with Patient 02/07/15 2109     Chief Complaint  Patient presents with  . Urinary Tract Infection     (Consider location/radiation/quality/duration/timing/severity/associated sxs/prior Treatment) HPI Comments: This is a 39 year old morbidly obese female who is sexually active with one partner.  He states that she had a urinary tract infection approximately a month ago with total resolution with the use of Keflex and Pyridium.  She states 2 days ago she started having similar symptoms.  Denies any vaginal discharge, fever, abdominal pain, nausea, vomiting, diarrhea  Patient is a 39 y.o. female presenting with urinary tract infection.  Urinary Tract Infection Pain quality:  Burning Pain severity:  Mild Onset quality:  Gradual Duration:  2 days Timing:  Intermittent Progression:  Unchanged Chronicity:  Recurrent Recent urinary tract infections: yes   Relieved by:  None tried Worsened by:  Nothing tried Ineffective treatments:  None tried Urinary symptoms: frequent urination   Associated symptoms: no abdominal pain, no fever, no flank pain, no nausea, no vaginal discharge and no vomiting   Risk factors: recurrent urinary tract infections and sexually active   Risk factors: not single kidney, no sexually transmitted infections and no urinary catheter     Past Medical History  Diagnosis Date  . Dentalgia   . UTI (lower urinary tract infection)    Past Surgical History  Procedure Laterality Date  . Cesarean section     No family history on file. Social History  Substance Use Topics  . Smoking status: Never Smoker   . Smokeless tobacco: Never Used  . Alcohol Use: Yes     Comment: occ   OB History    No data available     Review of Systems  Constitutional: Negative for fever and chills.  Gastrointestinal: Negative for nausea, vomiting and abdominal pain.  Genitourinary: Positive  for dysuria and frequency. Negative for flank pain, vaginal discharge and vaginal pain.  All other systems reviewed and are negative.     Allergies  Review of patient's allergies indicates no known allergies.  Home Medications   Prior to Admission medications   Medication Sig Start Date End Date Taking? Authorizing Provider  cephALEXin (KEFLEX) 500 MG capsule Take 1 capsule (500 mg total) by mouth 2 (two) times daily. 10/12/14   Danelle Berry, PA-C  phenazopyridine (PYRIDIUM) 95 MG tablet Take 1 tablet (95 mg total) by mouth 3 (three) times daily with meals. 02/07/15   Earley Favor, NP  sulfamethoxazole-trimethoprim (BACTRIM DS,SEPTRA DS) 800-160 MG tablet Take 1 tablet by mouth 2 (two) times daily. 02/07/15   Earley Favor, NP   BP 143/93 mmHg  Pulse 70  Temp(Src) 97.8 F (36.6 C) (Oral)  Resp 18  SpO2 98%  LMP 01/19/2015 (Approximate) Physical Exam  Constitutional: She appears well-developed and well-nourished.  HENT:  Head: Normocephalic.  Eyes: Pupils are equal, round, and reactive to light.  Cardiovascular: Normal rate and regular rhythm.   Pulmonary/Chest: Effort normal and breath sounds normal.  Abdominal: Soft. She exhibits no distension. There is no tenderness.  Musculoskeletal: Normal range of motion.  Neurological: She is alert.  Skin: Skin is warm and dry.  Nursing note and vitals reviewed.   ED Course  Procedures (including critical care time) Labs Review Labs Reviewed  URINALYSIS, ROUTINE W REFLEX MICROSCOPIC (NOT AT [facility]) - Abnormal; Notable for the following:    Color, Urine ORANGE (*)  Hgb urine dipstick SMALL (*)    Bilirubin Urine SMALL (*)    Ketones, ur 40 (*)    Protein, ur 30 (*)    Nitrite POSITIVE (*)    Leukocytes, UA LARGE (*)    All other components within normal limits  CBC WITH DIFFERENTIAL/PLATELET - Abnormal; Notable for the following:    Hemoglobin 11.5 (*)    MCH 25.8 (*)    RDW 16.4 (*)    All other components within normal limits   BASIC METABOLIC PANEL - Abnormal; Notable for the following:    Glucose, Bld 114 (*)    All other components within normal limits  URINE MICROSCOPIC-ADD ON - Abnormal; Notable for the following:    Squamous Epithelial / LPF 0-5 (*)    Bacteria, UA FEW (*)    All other components within normal limits  POC URINE PREG, ED    Imaging Review No results found. I have personally reviewed and evaluated these images and lab results as part of my medical decision-making.   EKG Interpretation None     patient's blood pressure was initially 161/104 at triage.  On arrival back into the room, her blood pressure was rechecked it was 143/93.  Patient was has been given resources to help her find a primary care doctor.  She's been instructed to check her blood pressure several times over the next couple weeks and document this.  Prior to an appointment with a primary care physician. He currently does not report any headache, blurry vision, shortness of breath or chest pain  MDM   Final diagnoses:  UTI (lower urinary tract infection)         Earley FavorGail Kamyah Wilhelmsen, NP 02/07/15 2132  Rolland PorterMark James, MD 02/21/15 [phone]

## 2015-02-07 NOTE — Discharge Instructions (Signed)
°Emergency Department Resource Guide °1) Find a Doctor and Pay Out of Pocket °Although you won't have to find out who is covered by your insurance plan, it is a good idea to ask around and get recommendations. You will then need to call the office and see if the doctor you have chosen will accept you as a new patient and what types of options they offer for patients who are self-pay. Some doctors offer discounts or will set up payment plans for their patients who do not have insurance, but you will need to ask so you aren't surprised when you get to your appointment. ° °2) Contact Your Local Health Department °Not all health departments have doctors that can see patients for sick visits, but many do, so it is worth a call to see if yours does. If you don't know where your local health department is, you can check in your phone book. The CDC also has a tool to help you locate your state's health department, and many state websites also have listings of all of their local health departments. ° °3) Find a Walk-in Clinic °If your illness is not likely to be very severe or complicated, you may want to try a walk in clinic. These are popping up all over the country in pharmacies, drugstores, and shopping centers. They're usually staffed by nurse practitioners or physician assistants that have been trained to treat common illnesses and complaints. They're usually fairly quick and inexpensive. However, if you have serious medical issues or chronic medical problems, these are probably not your best option. ° °No Primary Care Doctor: °- Call [facility] at  832-8000 - they can help you locate a primary care doctor that  accepts your insurance, provides certain services, etc. °- Physician Referral Service- 1-800-533-3463 ° °Chronic Pain Problems: °Organization         Address  Phone   Notes  °[facility]  (336) 297-2271 Patients need to be referred by their primary care doctor.  ° °Medication  Assistance: °Organization         Address  Phone   Notes  °[place] [facility] [address] °[place], [place] [address] (336) 641-8030 --Must be a resident of [place] °-- Must have NO insurance coverage whatsoever (no Medicaid/ Medicare, etc.) °-- The pt. MUST have a primary care doctor that directs their care regularly and follows them in the community °  °[facility]  (866) 331-1348   °[employer]  (888) 892-1162   ° °Agencies that provide inexpensive medical care: °Organization         Address  Phone   Notes  °[facility]  (336) 832-8035   °[facility]    (336) 832-7272   °[facility] [address] °[place], [place] [address] (336) 832-4777   °[facility] [address], °[place] (336) 271-4999   °[facility]    (336) 373-0678   °[facility]    (336) 272-1050   °[facility] ° [address], [place] Phone:  (336) 832-4444, Fax:  (336) 832-4440 Hours of Operation:  9 am - 6 pm, M-F.  Also accepts Medicaid/Medicare and self-pay.  °[facility] ° [address], [place] Phone: (336) 832-3150, Fax: (336) [phone]. Hours of Operation:  8:30 am - 5:30 pm, M-F.  Also accepts Medicaid and self-pay.  °[facility] [place] [address], [place] Phone: (336) 878-6027   °[facility] [address], [place], [place] (336)723-1848, Ext. 123 Mondays & Thursdays: 7-9 AM.  First 15 patients are seen on a first come, first serve basis. °  ° °Medicaid-accepting [place] Providers: ° °Organization         Address  Phone   Notes  °[facility] [address], [place] (336) 641-2100 Also accepts self-pay patients.  °[facility] [address], [place] ° (336) 856-9996   °[facility] [address], [place]  (336) 288-8857   °[facility] [address] [place] [address], [place] (336) 299-7000   °Veita Bland [address], [place]  ° (336) 373-1557 Only accepts [place] Access Medicaid patients after they have their name applied to their card.  ° °Self-Pay (no insurance) in [place]: ° °Organization         Address  Phone   Notes  °Sickle Cell Patients, [facility] [address], [place] (336) 832-1970   °[facility] [address], [place] (336) 832-4400   °[facility] ° [address], [place] (336) 992-4800   °[facility]/Dr. Osei-Bonsu ° [address] [place] [address], [place] or [address], [place] (336) 841-8500 Phone number for both [place] and [place] locations is the same.  °[facility] [address], [place] (336) 299-0000   °[facility] [address] [place] [address], [place] or [address] (336) 852-7530 °(336) 878-2260   °[facility] [address], [place] (336) 350-1642, phone; (336) 294-5005, fax Sees patients 1st and 3rd Saturday of every month.  Must not qualify for public or private insurance (i.e. Medicaid, Medicare, [place] Health Choice, Veterans' Benefits) • Household income should be no more than 200% of the poverty level •The clinic cannot treat you if you are pregnant or think you are pregnant • Sexually transmitted diseases are not treated at the clinic.  ° ° °Dental Care: °Organization         Address  Phone  Notes  °[place] [facility] [address], [place] (336) 641-6152 Accepts children up to age 21 who are enrolled in Medicaid or [place] Health Choice; pregnant women with a Medicaid card; and children who have applied for Medicaid or [place] Health Choice, but were declined, whose parents can pay a reduced fee at time of service.  °[place]  [facility] [place]  [address], [place] (336) 641-7733 Accepts children up to age 21 who are enrolled in Medicaid or [place] Health Choice; pregnant women with a Medicaid card; and children who have applied for Medicaid or [place] Health Choice, but were declined, whose parents can pay a reduced fee at time of service.  °[facility] ° [address], [place] (336) 641-4533 Patients are seen by appointment only. Walk-ins are not accepted. [facility] will see patients 18 years of age and older. °Monday - Tuesday (8am-5pm) °Most Wednesdays (8:30-5pm) °$30 per visit, cash only  °[facility] ° [address], [place] (336) 641-4533 Patients are seen by appointment only. Walk-ins are not accepted. [facility] will see patients 18 years of age and older. °One   Wednesday Evening (Monthly: Volunteer Based).  $30 per visit, cash only  °[facility]  (919) 537-3737 for adults; Children under age 4, call Graduate Pediatric Dentistry at (919) [phone]. Children aged 4-14, please call (919) [phone] to request a pediatric application. ° Dental services are provided in all areas of dental care including fillings, crowns and bridges, complete and partial dentures, implants, gum treatment, root canals, and extractions. Preventive care is also provided. Treatment is provided to both adults and children. °Patients are selected via a lottery and there is often a waiting list. °  °[facility] [address], °[place] ° (336) 763-8833 [email or web address] °  °[facility] [address], [place], [place] (336)723-1848, Ext. 123 Second and Fourth Thursday of each month, opens at 6:30 AM; Clinic ends at 9 AM.  Patients are seen on a first-come first-served basis, and a limited number are seen during each clinic.  ° °[facility] ° [address], [place], [place] (336) 723-7904    Eligibility Requirements °You must have lived in [place], [place], or [place] counties for at least the last three months. °  You cannot be eligible for state or federal sponsored healthcare insurance, including Veterans Administration, Medicaid, or Medicare. °  You generally cannot be eligible for healthcare insurance through your employer.  °  How to apply: °Eligibility screenings are held every Tuesday and Wednesday afternoon from 1:00 pm until 4:00 pm. You do not need an appointment for the interview!  °[facility] [address], [place], [place] 336-631-2330   °[facility]  336-342-8273   °[facility]  336-703-3100   °[facility]  336-570-6415   ° °Behavioral Health Resources in the Community: °Intensive Outpatient Programs °Organization         Address  Phone  Notes  °[facility] [address], [place], [place] 336-878-6098   °[facility] [address], [place], [place] 336-832-9800   °[facility] [address], [place], [place] ° 336-882-2125   °[facility] [address],  °[place], [place] 1-800-853-5163 or 336-641-4981   °Substance Abuse Resources °Organization         Address  Phone  Notes  °[facility]  336-882-2125   °[facility]  336-784-9470   °[facility]  336-285-9073   °[facility]  336-845-3988   °Residential & Outpatient Substance Abuse Program  1-800-659-3381   °Psychological Services °Organization         Address  Phone  Notes  °[facility]  336- 832-9600   °[facility]  336- 378-7881   °[facility] [address], [place] 1-800-853-5163 or 336-641-4981   ° °Mobile Crisis Teams °Organization         Address  Phone  Notes  °[facility], Mobile Crisis Care Unit  1-877-626-1772   °Assertive °[facility] ° [address]  [place], [place] 336-834-9664   °Sharon DeEsch [address] °[place] 336-554-5454   ° °Self-Help/Support Groups °Organization         Address  Phone             Notes  °[facility]  - variety of support groups  336- 373-1402 Call for more information  °Narcotics Anonymous (NA), [facility] [address], °[place]  2 meetings at this location  ° °  Residential Treatment Programs Organization         Address  Phone  Notes  [facility] [address],    [place]  [phone])[phone]   [facility]  [address], [place] [phone], [place], [place] [phone]   [facility] [address], [place] [phone]) [phone] Admissions: 8am-3pm M-F  [facility] [address]    [place], [place] [phone]) [phone]   [facility] [address], [place], [place] [phone]   [facility] [address]  [place], [place] [phone]   [facility] Intensive Outpatient [address], [place], [place] [phone]   [facility] [address]  [place], [place] [phone])[phone] or ([phone])[phone])[phone]   [facility] [address] [place], [place] [phone] Accepts Medicaid  [facility] [address]  [place] [phone] Substance Abuse/Addiction Treatment   [facility]         Address  Phone  Notes  [facility]  651-449-3435(888) 504-137-1715   Angie FavaJulie Brannon, PhD [address] [place], [place]   [phone](336) ([phone] or [phone](336) [phone]   [facility]   [address] [place], [place] [phone](336) [phone]   [facility] [address], [place], [place] (305)[phone](336) [phone] Insurance/Medicaid/sponsorship through [facility] [address]                                    [place], [place] [phone]) [phone] Therapy/tele-psych/case    [facility] [address]   [place], [place] [phone](336) [phone]    Dr. Lolly MustacheArfeen  [phone]) [phone]   [facility]  [facility] 1) [address], [place] 2) [address], [place] 3)  [address], [place] [phone](336) [phone](336) [phone]  [phone](336) [phone]   [facility] Child Abuse Hotline (585) 693-9724(336) (843)863-2767 or [phone](336) ([phone] (After Hours)     You have been give this list to help you obtain a primary care physician

## 2015-11-08 ENCOUNTER — Ambulatory Visit: Payer: Self-pay | Admitting: Family Medicine

## 2015-11-13 ENCOUNTER — Encounter: Payer: Self-pay | Admitting: Family Medicine

## 2015-11-13 ENCOUNTER — Ambulatory Visit (INDEPENDENT_AMBULATORY_CARE_PROVIDER_SITE_OTHER): Payer: BLUE CROSS/BLUE SHIELD | Admitting: Family Medicine

## 2015-11-13 VITALS — BP 144/86 | HR 62 | Ht 67.0 in | Wt 232.4 lb

## 2015-11-13 DIAGNOSIS — E669 Obesity, unspecified: Secondary | ICD-10-CM | POA: Diagnosis not present

## 2015-11-13 DIAGNOSIS — D649 Anemia, unspecified: Secondary | ICD-10-CM

## 2015-11-13 DIAGNOSIS — I10 Essential (primary) hypertension: Secondary | ICD-10-CM | POA: Insufficient documentation

## 2015-11-13 HISTORY — DX: Anemia, unspecified: D64.9

## 2015-11-13 LAB — CBC WITH DIFFERENTIAL/PLATELET
BASOS PCT: 1 %
Basophils Absolute: 46 cells/uL (ref 0–200)
EOS PCT: 1 %
Eosinophils Absolute: 46 cells/uL (ref 15–500)
HCT: 36.6 % (ref 35.0–45.0)
Hemoglobin: 11.5 g/dL — ABNORMAL LOW (ref 11.7–15.5)
LYMPHS PCT: 44 %
Lymphs Abs: 2024 cells/uL (ref 850–3900)
MCH: 24.2 pg — ABNORMAL LOW (ref 27.0–33.0)
MCHC: 31.4 g/dL — ABNORMAL LOW (ref 32.0–36.0)
MCV: 76.9 fL — ABNORMAL LOW (ref 80.0–100.0)
MONOS PCT: 8 %
MPV: 9.8 fL (ref 7.5–12.5)
Monocytes Absolute: 368 cells/uL (ref 200–950)
Neutro Abs: 2116 cells/uL (ref 1500–7800)
Neutrophils Relative %: 46 %
PLATELETS: 301 10*3/uL (ref 140–400)
RBC: 4.76 MIL/uL (ref 3.80–5.10)
RDW: 16.4 % — ABNORMAL HIGH (ref 11.0–15.0)
WBC: 4.6 10*3/uL (ref 4.0–10.5)

## 2015-11-13 MED ORDER — HYDROCHLOROTHIAZIDE 12.5 MG PO CAPS: 12.5000 mg | ORAL_CAPSULE | Freq: Every day | ORAL | 1 refills | Status: DC

## 2015-11-13 MED ORDER — AMLODIPINE BESYLATE 5 MG PO TABS
5.0000 mg | ORAL_TABLET | Freq: Every day | ORAL | 1 refills | Status: DC
Start: 2015-11-13 — End: 2015-12-13

## 2015-11-13 NOTE — Progress Notes (Signed)
   Subjective:    Patient ID: Jenna Yoder, female    DOB: May 15, 1975, 40 y.o.   MRN: [id number]  HPI Chief Complaint  Patient presents with  . Other    new pt, blood pressure-    She is new to the practice and here to establish care. She was diagnosed with HTN in May 2017. States she was taking HCTZ 25 mg daily for approximately 6-8 weeks but she stopped 2 weeks ago because she did not think the medication was working.  She has checks blood pressure daily and readings have been 130s/90s with and without the medication.  Denies fever, chills, headache, dizziness, chest pain, palpitations, DOE, numbness, tingling, weakness, GI or GU symptoms.   Previous medical care: [facility]  Other providers: [facility]  Past medical history: anemia- thinks related to heavy periods.  Surgeries: tubal ligation and C-section x 4 kids. Ages 20/16/14/10.   Social history: Lives with children, works at [employer].  Denies smoking, drinks alcohol- used to drink daily, now 1 day per week., denies drug use  Diet: cut back on salt.  Excerise: no   Family history of HTN.   Works at [employer].   Last Menstrual cycle: August 9th.  Pregnancies: 4 Contraception: tubal   Reviewed allergies, medications, past medical, surgical, family, and social history.   Review of Systems Pertinent positives and negatives in the history of present illness.     Objective:   Physical Exam BP (!) 144/86   Pulse 62   Ht 5\' 7"  (1.702 m)   Wt 232 lb 6.4 oz (105.4 kg)   BMI 36.40 kg/m  Alert and in no distress.  Pharyngeal area is normal. Neck is supple without adenopathy or thyromegaly. Cardiac exam shows a regular sinus rhythm without murmurs or gallops. Lungs are clear to auscultation. Extremities without edema.       Assessment & Plan:  Essential hypertension - Plan: Comprehensive metabolic panel  Anemia, unspecified - Plan: CBC with Differential/Platelet  Obesity  Discussed  HTN management and potential complications of uncontrolled HTN such as kidney disease, heart disease and stroke. Advised to start amlodipine 2.5 mg and HCTZ 12.5 mg for the first week and keep an eye on BP readings. If readings are not <140/90 then she will increase to amlodipine 5mg  and stay at HCTZ 12.5 mg dose. She will return in 2 weeks for a nurse visit to check BP and return for follow up with me in 1 month. She will bring in her BP cuff and readings to compare with ours. Also discussed lifestyle modifications to better control HTN and Obesity.  History of anemia, plan to check CBC.  Follow up pending labs.

## 2015-11-13 NOTE — Patient Instructions (Addendum)
Start 1/2 tab of Amlodipine and 1 tab of HCTZ for the first week. Check your blood pressures. If they are not <140/90 then start taking 1 tab of Amlodipine and 1 tab of HCTZ.  Follow up for a nurse visit for a blood pressure check in 2 weeks. Follow up in 1 month with me. Keep an eye on your blood pressures. I would like to see readings <140/80. If you are not seeing these readings then call us.   DASH Eating Plan DASH stands for "Dietary Approaches to Stop Hypertension." The DASH eating plan is a healthy eating plan that has been shown to reduce high blood pressure (hypertension). Additional health benefits may include reducing the risk of type 2 diabetes mellitus, heart disease, and stroke. The DASH eating plan may also help with weight loss. WHAT DO I NEED TO KNOW ABOUT THE DASH EATING PLAN? For the DASH eating plan, you will follow these general guidelines:  Choose foods with a percent daily value for sodium of less than 5% (as listed on the food label).  Use salt-free seasonings or herbs instead of table salt or sea salt.  Check with your health care provider or pharmacist before using salt substitutes.  Eat lower-sodium products, often labeled as "lower sodium" or "no salt added."  Eat fresh foods.  Eat more vegetables, fruits, and low-fat dairy products.  Choose whole grains. Look for the word "whole" as the first word in the ingredient list.  Choose fish and skinless chicken or Malaw[place]key more often than red meat. Limit fish, poultry, and meat to 6 oz (170 g) each day.  Limit sweets, desserts, sugars, and sugary drinks.  Choose heart-healthy fats.  Limit cheese to 1 oz (28 g) per day.  Eat more home-cooked food and less restaurant, buffet, and fast food.  Limit fried foods.  Cook foods using methods other than frying.  Limit canned vegetables. If you do use them, rinse them well to decrease the sodium.  When eating at a restaurant, ask that your food be prepared with less  salt, or no salt if possible. WHAT FOODS CAN I EAT? Seek help from a dietitian for individual calorie needs. Grains Whole grain or whole wheat bread. Brown rice. Whole grain or whole wheat pasta. Quinoa, bulgur, and whole grain cereals. Low-sodium cereals. Corn or whole wheat flour tortillas. Whole grain cornbread. Whole grain crackers. Low-sodium crackers. Vegetables Fresh or frozen vegetables (raw, steamed, roasted, or grilled). Low-sodium or reduced-sodium tomato and vegetable juices. Low-sodium or reduced-sodium tomato sauce and paste. Low-sodium or reduced-sodium canned vegetables.  Fruits All fresh, canned (in natural juice), or frozen fruits. Meat and Other Protein Products Ground beef (85% or leaner), grass-fed beef, or beef trimmed of fat. Skinless chicken or Mal[place]key. Ground chicken or [place]key. Pork trimmed of fat. All fish and seafood. Eggs. Dried beans, peas, or lentils. Unsalted nuts and seeds. Unsalted canned beans. Dairy Low-fat dairy products, such as skim or 1% milk, 2% or reduced-fat cheeses, low-fat ricotta or cottage cheese, or plain low-fat yogurt. Low-sodium or reduced-sodium cheeses. Fats and Oils Tub margarines without trans fats. Light or reduced-fat mayonnaise and salad dressings (reduced sodium). Avocado. Safflower, olive, or canola oils. Natural peanut or almond butter. Other Unsalted popcorn and pretzels. The items listed above may not be a complete list of recommended foods or beverages. Contact your dietitian for more options. WHAT FOODS ARE NOT RECOMMENDED? Grains White bread. White pasta. White rice. Refined cornbread. Bagels and croissants. Crackers that contain trans fat. Vegetables  Creamed or fried vegetables. Vegetables in a cheese sauce. Regular canned vegetables. Regular canned tomato sauce and paste. Regular tomato and vegetable juices. Fruits Dried fruits. Canned fruit in light or heavy syrup. Fruit juice. Meat and Other Protein Products Fatty cuts of  meat. Ribs, chicken wings, bacon, sausage, bologna, salami, chitterlings, fatback, hot dogs, bratwurst, and packaged luncheon meats. Salted nuts and seeds. Canned beans with salt. Dairy Whole or 2% milk, cream, half-and-half, and cream cheese. Whole-fat or sweetened yogurt. Full-fat cheeses or blue cheese. Nondairy creamers and whipped toppings. Processed cheese, cheese spreads, or cheese curds. Condiments Onion and garlic salt, seasoned salt, table salt, and sea salt. Canned and packaged gravies. Worcestershire sauce. Tartar sauce. Barbecue sauce. Teriyaki sauce. Soy sauce, including reduced sodium. Steak sauce. Fish sauce. Oyster sauce. Cocktail sauce. Horseradish. Ketchup and mustard. Meat flavorings and tenderizers. Bouillon cubes. Hot sauce. Tabasco sauce. Marinades. Taco seasonings. Relishes. Fats and Oils Butter, stick margarine, lard, shortening, ghee, and bacon fat. Coconut, palm kernel, or palm oils. Regular salad dressings. Other Pickles and olives. Salted popcorn and pretzels. The items listed above may not be a complete list of foods and beverages to avoid. Contact your dietitian for more information. WHERE CAN I FIND MORE INFORMATION? National Heart, Lung, and Blood Institute: [employer]Prom[employer].it   This information is not intended to replace advice given to you by your health care provider. Make sure you discuss any questions you have with your health care provider.   Document Released: 02/13/2011 Document Revised: 03/17/2014 Document Reviewed: 12/29/2012 Elsevier Interactive Patient Education [employer]

## 2015-11-14 LAB — COMPREHENSIVE METABOLIC PANEL
ALT: 10 U/L (ref 6–29)
AST: 15 U/L (ref 10–30)
Albumin: 4.2 g/dL (ref 3.6–5.1)
Alkaline Phosphatase: 75 U/L (ref 33–115)
BUN: 8 mg/dL (ref 7–25)
CHLORIDE: 100 mmol/L (ref 98–110)
CO2: 23 mmol/L (ref 20–31)
Calcium: 10 mg/dL (ref 8.6–10.2)
Creat: 0.68 mg/dL (ref 0.50–1.10)
GLUCOSE: 96 mg/dL (ref 65–99)
POTASSIUM: 4 mmol/L (ref 3.5–5.3)
Sodium: 137 mmol/L (ref 135–146)
Total Bilirubin: 0.2 mg/dL (ref 0.2–1.2)
Total Protein: 7.7 g/dL (ref 6.1–8.1)

## 2015-11-27 ENCOUNTER — Other Ambulatory Visit: Payer: BLUE CROSS/BLUE SHIELD

## 2015-12-13 ENCOUNTER — Encounter: Payer: Self-pay | Admitting: Family Medicine

## 2015-12-13 ENCOUNTER — Ambulatory Visit (INDEPENDENT_AMBULATORY_CARE_PROVIDER_SITE_OTHER): Payer: BLUE CROSS/BLUE SHIELD | Admitting: Family Medicine

## 2015-12-13 VITALS — BP 132/82 | HR 72 | Wt 226.0 lb

## 2015-12-13 DIAGNOSIS — I10 Essential (primary) hypertension: Secondary | ICD-10-CM

## 2015-12-13 DIAGNOSIS — D649 Anemia, unspecified: Secondary | ICD-10-CM | POA: Diagnosis not present

## 2015-12-13 MED ORDER — HYDROCHLOROTHIAZIDE 12.5 MG PO CAPS: 12.5000 mg | ORAL_CAPSULE | Freq: Every day | ORAL | 5 refills | Status: DC

## 2015-12-13 MED ORDER — AMLODIPINE BESYLATE 5 MG PO TABS: 5.0000 mg | ORAL_TABLET | Freq: Every day | ORAL | 5 refills | Status: DC

## 2015-12-13 NOTE — Progress Notes (Signed)
   Subjective:    Patient ID: Jenna Yoder, female    DOB: [birth year], 40 y.o.   MRN: [id number]  HPI Chief Complaint  Patient presents with  . follow-up    follow-up on blood pressure and anemia   She is here for follow on HTN and anemia. She reports she has been taking daily amlodipine and hydrochlorothiazide as prescribed. Doing well and having no issues with this. [facility].    States she has been eating healthy, reduced carbohydrates and sodas. Joined a gym and plans to start exercising. She plans to work on weight loss. No concerns or complaints today.  States she is aware that she is slightly anemic but has not started taking iron.   Denies fever, chills, fatigue, chest pain, palpitations, DOE, abdominal pain, N/V/D.   Tubal ligation in past.   Past Medical History:  Diagnosis Date  . Anemia   . Dentalgia   . HTN (hypertension)   . UTI (lower urinary tract infection)    Past Surgical History:  Procedure Laterality Date  . CESAREAN SECTION    . TUBAL LIGATION       Review of Systems Pertinent positives and negatives in the history of present illness.     Objective:   Physical Exam BP 132/82   Pulse 72   Wt 226 lb (102.5 kg)   LMP 10/17/2015 (Exact Date)   BMI 35.40 kg/m   Alert and oriented and in no acute distress, not otherwise examined.       Assessment & Plan:  Anemia, unspecified type  Essential hypertension - Plan: amLODipine (NORVASC) 5 MG tablet, hydrochlorothiazide (MICROZIDE) 12.5 MG capsule  Does not want to take iron. Advised that her hemoglobin is marginally low and she is not symptomatic. Will recheck in 6-12 months. She is up to date on Pap smear and pelvic exam. No sign of bleeding.  Her blood pressure is within goal and she is doing well on the medication. She will continue on current medications. She will check her BP at home periodically.  Follow up 3 months and if  she continues to be at goal will stretch out her visits to annually.

## 2016-01-10 ENCOUNTER — Emergency Department (HOSPITAL_COMMUNITY): Payer: BLUE CROSS/BLUE SHIELD

## 2016-01-10 ENCOUNTER — Encounter (HOSPITAL_COMMUNITY): Payer: Self-pay | Admitting: *Deleted

## 2016-01-10 ENCOUNTER — Emergency Department (HOSPITAL_COMMUNITY)
Admission: EM | Admit: 2016-01-10 | Discharge: 2016-01-10 | Disposition: A | Discharge status: Home / Self Care | Payer: BLUE CROSS/BLUE SHIELD | Attending: Emergency Medicine | Admitting: Emergency Medicine

## 2016-01-10 ENCOUNTER — Encounter: Payer: Self-pay | Admitting: Family Medicine

## 2016-01-10 ENCOUNTER — Ambulatory Visit (INDEPENDENT_AMBULATORY_CARE_PROVIDER_SITE_OTHER): Payer: BLUE CROSS/BLUE SHIELD | Admitting: Family Medicine

## 2016-01-10 VITALS — BP 110/70 | HR 69 | Temp 98.4°F | Wt 223.6 lb

## 2016-01-10 DIAGNOSIS — R109 Unspecified abdominal pain: Secondary | ICD-10-CM

## 2016-01-10 DIAGNOSIS — I10 Essential (primary) hypertension: Secondary | ICD-10-CM | POA: Diagnosis not present

## 2016-01-10 DIAGNOSIS — N949 Unspecified condition associated with female genital organs and menstrual cycle: Secondary | ICD-10-CM

## 2016-01-10 DIAGNOSIS — R1031 Right lower quadrant pain: Secondary | ICD-10-CM | POA: Diagnosis not present

## 2016-01-10 DIAGNOSIS — R1030 Lower abdominal pain, unspecified: Secondary | ICD-10-CM | POA: Diagnosis present

## 2016-01-10 DIAGNOSIS — N838 Other noninflammatory disorders of ovary, fallopian tube and broad ligament: Secondary | ICD-10-CM | POA: Diagnosis not present

## 2016-01-10 DIAGNOSIS — R1084 Generalized abdominal pain: Secondary | ICD-10-CM | POA: Diagnosis not present

## 2016-01-10 LAB — COMPREHENSIVE METABOLIC PANEL
ALT: 12 U/L — ABNORMAL LOW (ref 14–54)
ANION GAP: 6 (ref 5–15)
AST: 19 U/L (ref 15–41)
Albumin: 3.6 g/dL (ref 3.5–5.0)
Alkaline Phosphatase: 66 U/L (ref 38–126)
BUN: 10 mg/dL (ref 6–20)
CHLORIDE: 103 mmol/L (ref 101–111)
CO2: 26 mmol/L (ref 22–32)
CREATININE: 0.69 mg/dL (ref 0.44–1.00)
Calcium: 9.7 mg/dL (ref 8.9–10.3)
Glucose, Bld: 101 mg/dL — ABNORMAL HIGH (ref 65–99)
POTASSIUM: 3.7 mmol/L (ref 3.5–5.1)
SODIUM: 135 mmol/L (ref 135–145)
Total Bilirubin: 0.5 mg/dL (ref 0.3–1.2)
Total Protein: 7.6 g/dL (ref 6.5–8.1)

## 2016-01-10 LAB — URINALYSIS, ROUTINE W REFLEX MICROSCOPIC
Bilirubin Urine: NEGATIVE
GLUCOSE, UA: NEGATIVE mg/dL
Hgb urine dipstick: NEGATIVE
Ketones, ur: NEGATIVE mg/dL
LEUKOCYTES UA: NEGATIVE
Nitrite: NEGATIVE
PROTEIN: NEGATIVE mg/dL
SPECIFIC GRAVITY, URINE: 1.024 (ref 1.005–1.030)
pH: 7.5 (ref 5.0–8.0)

## 2016-01-10 LAB — CBC
HEMATOCRIT: 35 % — AB (ref 36.0–46.0)
HEMOGLOBIN: 10.6 g/dL — AB (ref 12.0–15.0)
MCH: 23.4 pg — ABNORMAL LOW (ref 26.0–34.0)
MCHC: 30.3 g/dL (ref 30.0–36.0)
MCV: 77.3 fL — AB (ref 78.0–100.0)
PLATELETS: 293 10*3/uL (ref 150–400)
RBC: 4.53 MIL/uL (ref 3.87–5.11)
RDW: 17.6 % — ABNORMAL HIGH (ref 11.5–15.5)
WBC: 7 10*3/uL (ref 4.0–10.5)

## 2016-01-10 LAB — POC URINE PREG, ED: Preg Test, Ur: NEGATIVE

## 2016-01-10 LAB — LIPASE, BLOOD: LIPASE: 31 U/L (ref 11–51)

## 2016-01-10 MED ORDER — ONDANSETRON HCL 4 MG PO TABS: 4.0000 mg | ORAL_TABLET | Freq: Four times a day (QID) | ORAL | 0 refills | Status: DC

## 2016-01-10 MED ORDER — HYDROCODONE-ACETAMINOPHEN 5-325 MG PO TABS: 1.0000 | ORAL_TABLET | ORAL | 0 refills | Status: DC | PRN

## 2016-01-10 MED ORDER — OXYCODONE-ACETAMINOPHEN 5-325 MG PO TABS
1.0000 | ORAL_TABLET | Freq: Once | ORAL | Status: AC
  Administered 2016-01-10: 1 via ORAL
  Filled 2016-01-10: qty 1

## 2016-01-10 MED ORDER — DICYCLOMINE HCL 20 MG PO TABS: 20.0000 mg | ORAL_TABLET | Freq: Two times a day (BID) | ORAL | 0 refills | Status: DC

## 2016-01-10 MED ORDER — IOPAMIDOL (ISOVUE-300) INJECTION 61%
INTRAVENOUS | Status: AC
  Administered 2016-01-10: 100 mL
  Filled 2016-01-10: qty 100

## 2016-01-10 MED ORDER — ONDANSETRON HCL 4 MG/2ML IJ SOLN
4.0000 mg | Freq: Once | INTRAMUSCULAR | Status: AC
  Administered 2016-01-10: 4 mg via INTRAVENOUS
  Filled 2016-01-10: qty 2

## 2016-01-10 MED ORDER — MORPHINE SULFATE (PF) 4 MG/ML IV SOLN
4.0000 mg | Freq: Once | INTRAVENOUS | Status: AC
  Administered 2016-01-10: 4 mg via INTRAVENOUS
  Filled 2016-01-10: qty 1

## 2016-01-10 MED ORDER — SODIUM CHLORIDE 0.9 % IV BOLUS (SEPSIS)
1000.0000 mL | Freq: Once | INTRAVENOUS | Status: AC
  Administered 2016-01-10: 1000 mL via INTRAVENOUS

## 2016-01-10 NOTE — Progress Notes (Signed)
   Subjective:    Patient ID: Jenna Yoder, female    DOB: [birth year], 40 y.o.   MRN: [id number]  HPI Chief Complaint  Patient presents with  . hospital follow-up    hopsital follow-up- still in pain   She is here for follow up on RLQ pain that started 2 days ago. She was evaluated and treated in the ED this morning. CT showed an 8 cm right adnexal cyst. Pregnancy ruled out. She does not have fever, chills,  N/V/D or urinary symptoms. Also denies vaginal discharge. She states she is due any day for her menstrual cycle.  She has a history of C-Section and tubal ligation.   The following results are from her ED visit.   IMPRESSION: 1. 8 cm right adnexal cyst. Recommend pelvic ultrasound or pelvic MRI to characterize. 2. Probable adhesion between the myometrial C-section scar and the anterior abdominal wall.  Labs Reviewed  COMPREHENSIVE METABOLIC PANEL - Abnormal; Notable for the following:       Result Value    Glucose, Bld 101 (*)    ALT 12 (*)    All other components within normal limits  CBC - Abnormal; Notable for the following:    Hemoglobin 10.6 (*)    HCT 35.0 (*)    MCV 77.3 (*)    MCH 23.4 (*)    RDW 17.6 (*)    All other components within normal limits  LIPASE, BLOOD  URINALYSIS, ROUTINE W REFLEX MICROSCOPIC (NOT [facility])  POC URINE PREG, ED   Past Medical History:  Diagnosis Date  . Anemia   . Dentalgia   . HTN (hypertension)   . UTI (lower urinary tract infection)    Past Surgical History:  Procedure Laterality Date  . CESAREAN SECTION    . TUBAL LIGATION        Review of Systems Pertinent positives and negatives in the history of present illness.     Objective:   Physical Exam  Constitutional: She appears well-developed and well-nourished. No distress.  HENT:  Mouth/Throat: Oropharynx is clear and moist and mucous membranes are normal.  Cardiovascular: Normal rate, regular rhythm, normal heart sounds and intact distal  pulses.   Pulmonary/Chest: Effort normal and breath sounds normal.  Abdominal: Soft. Bowel sounds are normal. She exhibits no distension. There is no hepatosplenomegaly. There is tenderness in the right lower quadrant. There is no rigidity, no rebound, no guarding, no CVA tenderness, no tenderness at McBurney's point and negative Murphy's sign.  Skin: Skin is warm and dry. No rash noted. No pallor.   BP 110/70   Pulse 69   Temp 98.4 F (36.9 C) (Oral)   Wt 223 lb 9.6 oz (101.4 kg)   LMP 01/10/2016   BMI 35.02 kg/m       Assessment & Plan:  RLQ abdominal pain  Adnexal cyst  Discussed that ectopic pregnancy was ruled out in the ED this morning. She has prescriptions with her for Zofran, Hydrocodone, and Bentyl that were given to her upon leaving the ED earlier today. She has not filled these yet. Samples of Aleve given #12 tabs. Discussed pain management. She is scheduled for an appointment with OB/GYN tomorrow afternoon. She is ok with this plan. If she worsens, has fever or worrisome symptoms, she knows to return to the ED.

## 2016-01-10 NOTE — ED Triage Notes (Signed)
P[t c.o lower abd pain for 2 days no n v or diarrhea  lmp oct 11

## 2016-01-10 NOTE — ED Provider Notes (Signed)
[facility]-EMER[facility]ENCY DEPT Provider Note   CSN: [id number] Arrival date & time: 01/10/16  0153  History   Chief Complaint Chief Complaint  Patient presents with  . Abdominal Pain    HPI Jenna Yoder is a 40 y.o. female.  HPI  Patient with PMH of dentalgia, anemia, hypertension and UTI comes to the ER with complaints of two days of worsening low abdominal pain. She has not had associated N/V/D, F, dysuria, cough, CP, SOB, vaginal discharge or bleeding. She denies having similar abdominal pain in the past. Denies having hx of abdominal surgeries.  Past Medical History:  Diagnosis Date  . Anemia   . Dentalgia   . HTN (hypertension)   . UTI (lower urinary tract infection)     Patient Active Problem List   Diagnosis Date Noted  . Essential hypertension 11/13/2015  . Anemia 11/13/2015  . Obesity 11/13/2015   Past Surgical History:  Procedure Laterality Date  . CESAREAN SECTION    . TUBAL LIGATION     OB History    No data available     Home Medications    Prior to Admission medications   Medication Sig Start Date End Date Taking? Authorizing Provider  amLODipine (NORVASC) 5 MG tablet Take 1 tablet (5 mg total) by mouth daily. 12/13/15  Yes Avanell ShackletonVickie L Henson, NP  hydrochlorothiazide (MICROZIDE) 12.5 MG capsule Take 1 capsule (12.5 mg total) by mouth daily. 12/13/15  Yes Avanell ShackletonVickie L Henson, NP  dicyclomine (BENTYL) 20 MG tablet Take 1 tablet (20 mg total) by mouth 2 (two) times daily. 01/10/16   Marlon Peliffany Shalika Arntz, PA-C  HYDROcodone-acetaminophen (NORCO/VICODIN) 5-325 MG tablet Take 1-2 tablets by mouth every 4 (four) hours as needed. 01/10/16   Kalika Smay Neva SeatGreene, PA-C  ondansetron (ZOFRAN) 4 MG tablet Take 1 tablet (4 mg total) by mouth every 6 (six) hours. 01/10/16   Marlon Peliffany Kainat Pizana, PA-C    Family History No family history on file.  Social History Social History  Substance Use Topics  . Smoking status: Never Smoker  . Smokeless tobacco: Never Used  . Alcohol use Yes   Comment: occ    Allergies   Review of patient's allergies indicates no known allergies.  Review of Systems Review of Systems  Review of Systems All other systems negative except as documented in the HPI. All pertinent positives and negatives as reviewed in the HPI.  Physical Exam Updated Vital Signs BP 120/79   Pulse 78   Temp 98.2 F (36.8 C)   Resp 18   Ht 5\' 7"  (1.702 m)   Wt 101.4 kg   LMP 01/10/2016   SpO2 98%   BMI 35.01 kg/m   Physical Exam  Constitutional: She appears well-developed and well-nourished. No distress.  HENT:  Head: Normocephalic and atraumatic.  Mouth/Throat: Uvula is midline.  Eyes: Pupils are equal, round, and reactive to light.  Neck: Normal range of motion. Neck supple.  Cardiovascular: Normal rate and regular rhythm.   Pulmonary/Chest: Effort normal.  Abdominal: Soft. Bowel sounds are normal. She exhibits no distension, no pulsatile liver, no fluid wave and no ascites. There is tenderness in the right upper quadrant, right lower quadrant, epigastric area, periumbilical area, suprapubic area and left lower quadrant. There is no rigidity, no rebound, no guarding, no CVA tenderness and negative Murphy's sign.  No signs of abdominal distention Abdominal pain is diffuse but spares the left upper quadrant  Musculoskeletal:  No LE swelling  Neurological: She is alert.  Acting at baseline  Skin: Skin  is warm and dry. No rash noted.  Nursing note and vitals reviewed.   ED Treatments / Results  Labs (all labs ordered are listed, but only abnormal results are displayed) Labs Reviewed  COMPREHENSIVE METABOLIC PANEL - Abnormal; Notable for the following:       Result Value   Glucose, Bld 101 (*)    ALT 12 (*)    All other components within normal limits  CBC - Abnormal; Notable for the following:    Hemoglobin 10.6 (*)    HCT 35.0 (*)    MCV 77.3 (*)    MCH 23.4 (*)    RDW 17.6 (*)    All other components within normal limits  LIPASE, BLOOD    URINALYSIS, ROUTINE W REFLEX MICROSCOPIC (NOT AT [facility])  POC URINE PREG, ED    EKG  EKG Interpretation None       Radiology Ct Abdomen Pelvis W Contrast  Result Date: 01/10/2016 CLINICAL DATA:  Right lower quadrant pain for 2 days. EXAM: CT ABDOMEN AND PELVIS WITH CONTRAST TECHNIQUE: Multidetector CT imaging of the abdomen and pelvis was performed using the standard protocol following bolus administration of intravenous contrast. CONTRAST:  ISOVUE-300 IOPAMIDOL (ISOVUE-300) INJECTION 61% COMPARISON:  None. FINDINGS: Lower chest: No acute abnormality. Hepatobiliary: No focal liver abnormality is seen. No gallstones, gallbladder wall thickening, or biliary dilatation. Pancreas: Unremarkable. No pancreatic ductal dilatation or surrounding inflammatory changes. Spleen: Normal in size without focal abnormality. Adrenals/Urinary Tract: Adrenal glands are unremarkable. Kidneys are normal, without renal calculi, focal lesion, or hydronephrosis. Bladder is unremarkable. Stomach/Bowel: Stomach is within normal limits. Appendix appears normal. No evidence of bowel wall thickening, distention, or inflammatory changes. Vascular/Lymphatic: No significant vascular findings are present. No enlarged abdominal or pelvic lymph nodes. Reproductive: The uterus is mildly deformed and appears to be adherent to the midline anterior abdominal wall at the [facility] C-section scar. There is an 8 cm cyst of the right adnexal region. Left adnexal structures appear unremarkable. Other: No acute inflammatory changes are evident in the abdomen or pelvis. There is no ascites. There is rectus abdominus diastases and a fat containing umbilical hernia. Musculoskeletal: No acute or significant osseous findings. IMPRESSION: 1. 8 cm right adnexal cyst. Recommend pelvic ultrasound or pelvic MRI to characterize. 2. Probable adhesion between the myometrial C-section scar and the anterior abdominal wall. Electronically Signed   By:  Ellery Plunk M.D.   On: 01/10/2016 05:05    Procedures Procedures (including critical care time)  Medications Ordered in ED Medications  sodium chloride 0.9 % bolus 1,000 mL (1,000 mLs Intravenous New Bag/Given 01/10/16 0421)  oxyCODONE-acetaminophen (PERCOCET/ROXICET) 5-325 MG per tablet 1 tablet (not administered)  morphine 4 MG/ML injection 4 mg (4 mg Intravenous Given 01/10/16 0421)  ondansetron (ZOFRAN) injection 4 mg (4 mg Intravenous Given 01/10/16 0420)  iopamidol (ISOVUE-300) 61 % injection (100 mLs  Contrast Given 01/10/16 0443)    Initial Impression / Assessment and Plan / ED Course  I have reviewed the triage vital signs and the nursing notes.  Pertinent labs & imaging results that were available during my care of the patient were reviewed by me and considered in my medical decision making (see chart for details).  Clinical Course    Discussed case with DR. Campos, the 8 cm adnexal cyst can be worked up as an outpatient. Patient is nontoxic, nonseptic appearing, in no apparent distress.  Patient's pain and other symptoms adequately managed in emergency department.  Fluid bolus given.  Labs,  imaging and vitals reviewed.  Patient does not meet the SIRS or Sepsis criteria.  On repeat exam patient does not have a surgical abdomin and there are no peritoneal signs.  No indication of appendicitis, bowel obstruction, bowel perforation, cholecystitis, diverticulitis, PID or ectopic pregnancy.  Patient discharged home with symptomatic treatment and given strict instructions for follow-up with their primary care physician.  I have also discussed reasons to return immediately to the ER.  Patient expresses understanding and agrees with plan.   Final Clinical Impressions(s) / ED Diagnoses   Final diagnoses:  Adnexal cyst  Abdominal pain, unspecified abdominal location    New Prescriptions New Prescriptions   DICYCLOMINE (BENTYL) 20 MG TABLET    Take 1 tablet (20 mg total) by mouth  2 (two) times daily.   HYDROCODONE-ACETAMINOPHEN (NORCO/VICODIN) 5-325 MG TABLET    Take 1-2 tablets by mouth every 4 (four) hours as needed.   ONDANSETRON (ZOFRAN) 4 MG TABLET    Take 1 tablet (4 mg total) by mouth every 6 (six) hours.     Marlon Peliffany Nariya Neumeyer, PA-C 01/10/16 [id number]    Azalia BilisKevin Campos, MD 01/10/16 [phone]

## 2016-01-16 ENCOUNTER — Emergency Department (HOSPITAL_COMMUNITY)
Admission: EM | Admit: 2016-01-16 | Discharge: 2016-01-16 | Disposition: A | Discharge status: Home / Self Care | Payer: BLUE CROSS/BLUE SHIELD | Attending: Emergency Medicine | Admitting: Emergency Medicine

## 2016-01-16 ENCOUNTER — Emergency Department (HOSPITAL_COMMUNITY): Payer: BLUE CROSS/BLUE SHIELD

## 2016-01-16 DIAGNOSIS — Y92410 Unspecified street and highway as the place of occurrence of the external cause: Secondary | ICD-10-CM | POA: Diagnosis not present

## 2016-01-16 DIAGNOSIS — S40012A Contusion of left shoulder, initial encounter: Secondary | ICD-10-CM | POA: Diagnosis not present

## 2016-01-16 DIAGNOSIS — S161XXA Strain of muscle, fascia and tendon at neck level, initial encounter: Secondary | ICD-10-CM | POA: Diagnosis not present

## 2016-01-16 DIAGNOSIS — I10 Essential (primary) hypertension: Secondary | ICD-10-CM | POA: Diagnosis not present

## 2016-01-16 DIAGNOSIS — S0990XA Unspecified injury of head, initial encounter: Secondary | ICD-10-CM | POA: Diagnosis not present

## 2016-01-16 DIAGNOSIS — Y939 Activity, unspecified: Secondary | ICD-10-CM | POA: Insufficient documentation

## 2016-01-16 DIAGNOSIS — Y999 Unspecified external cause status: Secondary | ICD-10-CM | POA: Diagnosis not present

## 2016-01-16 DIAGNOSIS — S4992XA Unspecified injury of left shoulder and upper arm, initial encounter: Secondary | ICD-10-CM | POA: Diagnosis present

## 2016-01-16 MED ORDER — FENTANYL CITRATE (PF) 100 MCG/2ML IJ SOLN
100.0000 ug | Freq: Once | INTRAMUSCULAR | Status: AC
  Administered 2016-01-16: 100 ug via INTRAMUSCULAR
  Filled 2016-01-16: qty 2

## 2016-01-16 MED ORDER — IBUPROFEN 800 MG PO TABS: 800.0000 mg | ORAL_TABLET | Freq: Three times a day (TID) | ORAL | 0 refills | Status: DC

## 2016-01-16 MED ORDER — CYCLOBENZAPRINE HCL 10 MG PO TABS: 10.0000 mg | ORAL_TABLET | Freq: Two times a day (BID) | ORAL | 0 refills | Status: DC | PRN

## 2016-01-16 MED ORDER — TRAMADOL HCL 50 MG PO TABS: 50.0000 mg | ORAL_TABLET | Freq: Four times a day (QID) | ORAL | 0 refills | Status: DC | PRN
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
## 2016-01-16 NOTE — ED Notes (Signed)
Pt that was just discharged from E45 is now at this pt's bedside.

## 2016-02-06 ENCOUNTER — Encounter (HOSPITAL_COMMUNITY): Payer: Self-pay

## 2016-02-06 ENCOUNTER — Emergency Department (HOSPITAL_COMMUNITY)
Admission: EM | Admit: 2016-02-06 | Discharge: 2016-02-06 | Disposition: A | Discharge status: Home / Self Care | Payer: BLUE CROSS/BLUE SHIELD | Attending: Emergency Medicine | Admitting: Emergency Medicine

## 2016-02-06 DIAGNOSIS — M542 Cervicalgia: Secondary | ICD-10-CM | POA: Diagnosis present

## 2016-02-06 DIAGNOSIS — Y939 Activity, unspecified: Secondary | ICD-10-CM | POA: Insufficient documentation

## 2016-02-06 DIAGNOSIS — Y999 Unspecified external cause status: Secondary | ICD-10-CM | POA: Diagnosis not present

## 2016-02-06 DIAGNOSIS — Y92410 Unspecified street and highway as the place of occurrence of the external cause: Secondary | ICD-10-CM | POA: Insufficient documentation

## 2016-02-06 DIAGNOSIS — I10 Essential (primary) hypertension: Secondary | ICD-10-CM | POA: Insufficient documentation

## 2016-02-06 DIAGNOSIS — M7918 Myalgia, other site: Secondary | ICD-10-CM

## 2016-02-06 MED ORDER — METHOCARBAMOL 500 MG PO TABS: 500.0000 mg | ORAL_TABLET | Freq: Two times a day (BID) | ORAL | 0 refills | Status: DC

## 2016-02-06 MED ORDER — IBUPROFEN 400 MG PO TABS
400.0000 mg | ORAL_TABLET | Freq: Once | ORAL | Status: AC
  Administered 2016-02-06: 400 mg via ORAL
  Filled 2016-02-06: qty 1

## 2016-02-06 MED ORDER — IBUPROFEN 600 MG PO TABS: 600.0000 mg | ORAL_TABLET | Freq: Four times a day (QID) | ORAL | 0 refills | Status: DC | PRN

## 2016-02-06 MED ORDER — METHOCARBAMOL 500 MG PO TABS
500.0000 mg | ORAL_TABLET | Freq: Once | ORAL | Status: AC
  Administered 2016-02-06: 500 mg via ORAL
  Filled 2016-02-06: qty 1
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
## 2016-02-06 NOTE — ED Notes (Signed)
Pt comfortable with discharge and follow up instructions. Pt declines wheelchair, escorted to waiting area by this RN. Rx x2 
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
## 2016-03-17 ENCOUNTER — Encounter: Payer: BLUE CROSS/BLUE SHIELD | Admitting: Family Medicine

## 2016-04-07 ENCOUNTER — Encounter: Payer: BLUE CROSS/BLUE SHIELD | Admitting: Family Medicine

## 2016-04-16 ENCOUNTER — Encounter: Payer: Self-pay | Admitting: Family Medicine

## 2016-04-16 ENCOUNTER — Encounter: Payer: BLUE CROSS/BLUE SHIELD | Admitting: Family Medicine

## 2017-06-04 DIAGNOSIS — B9689 Other specified bacterial agents as the cause of diseases classified elsewhere: Secondary | ICD-10-CM | POA: Insufficient documentation

## 2017-06-04 DIAGNOSIS — B009 Herpesviral infection, unspecified: Secondary | ICD-10-CM | POA: Insufficient documentation

## 2017-06-04 DIAGNOSIS — N760 Acute vaginitis: Secondary | ICD-10-CM

## 2017-08-27 ENCOUNTER — Ambulatory Visit (INDEPENDENT_AMBULATORY_CARE_PROVIDER_SITE_OTHER): Payer: BLUE CROSS/BLUE SHIELD

## 2017-08-27 ENCOUNTER — Ambulatory Visit (INDEPENDENT_AMBULATORY_CARE_PROVIDER_SITE_OTHER): Payer: BLUE CROSS/BLUE SHIELD | Admitting: Podiatry

## 2017-08-27 ENCOUNTER — Encounter: Payer: Self-pay | Admitting: Podiatry

## 2017-08-27 VITALS — BP 115/75 | HR 73 | Resp 16

## 2017-08-27 DIAGNOSIS — M779 Enthesopathy, unspecified: Secondary | ICD-10-CM

## 2017-08-27 DIAGNOSIS — M7752 Other enthesopathy of left foot: Secondary | ICD-10-CM

## 2017-08-27 MED ORDER — MELOXICAM 15 MG PO TABS: 15.0000 mg | ORAL_TABLET | Freq: Every day | ORAL | 3 refills | Status: DC

## 2017-08-27 MED ORDER — METHYLPREDNISOLONE 4 MG PO TBPK: ORAL_TABLET | ORAL | 0 refills | Status: DC
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
## 2017-10-01 ENCOUNTER — Ambulatory Visit: Payer: BLUE CROSS/BLUE SHIELD | Admitting: Podiatry

## 2017-10-15 ENCOUNTER — Ambulatory Visit (INDEPENDENT_AMBULATORY_CARE_PROVIDER_SITE_OTHER): Payer: BLUE CROSS/BLUE SHIELD | Admitting: Podiatry

## 2017-10-15 ENCOUNTER — Encounter: Payer: Self-pay | Admitting: Podiatry

## 2017-10-15 DIAGNOSIS — M779 Enthesopathy, unspecified: Secondary | ICD-10-CM | POA: Diagnosis not present

## 2017-10-15 DIAGNOSIS — M7752 Other enthesopathy of left foot: Secondary | ICD-10-CM

## 2017-10-15 NOTE — Progress Notes (Signed)
She presents today chief complaint of pain to her plantar fascial area and sinus tarsi area.  She states that exquisitely painful.  Objective: Vital signs are stable alert and oriented x3.  Pulses are palpable.  She is severe pain on palpation of the sinus tarsi on and on end range of motion.  She also has pain on palpation of the plantar fascia and of the Achilles at its insertion site.  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  Plan: Since conservative therapies have failed to render her asymptomatic I feel is necessary for an MRI to consider surgical intervention.  Follow-up with her once this is performed.

## 2017-10-16 ENCOUNTER — Telehealth: Payer: Self-pay | Admitting: *Deleted

## 2017-10-16 DIAGNOSIS — M7752 Other enthesopathy of left foot: Secondary | ICD-10-CM

## 2017-10-16 NOTE — Telephone Encounter (Signed)
Orders to J. Quintana, RN for pre-cert, faxed to [facility]. 

## 2017-10-16 NOTE — Telephone Encounter (Signed)
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

## 2017-10-31 ENCOUNTER — Ambulatory Visit
Admission: RE | Admit: 2017-10-31 | Discharge: 2017-10-31 | Disposition: A | Discharge status: Home / Self Care | Payer: BLUE CROSS/BLUE SHIELD | Source: Ambulatory Visit | Attending: Podiatry | Admitting: Podiatry

## 2017-11-03 ENCOUNTER — Telehealth: Payer: Self-pay | Admitting: *Deleted

## 2017-11-03 NOTE — Telephone Encounter (Signed)
Left message informing pt of Dr. Geryl RankinsHyatt's review of results and request to send copy of MRI disc to radiology specialist for indepth information for treatment planning, there would be a 2 week delay in the final results and we would call with instructions once results were reviewed.

## 2017-11-03 NOTE — Telephone Encounter (Signed)
-----   Message from Elinor ParkinsonMax T Hyatt, [place] sent at 11/03/2017  7:05 AM EDT ----- Send of over read and inform the patient of the delay.

## 2017-11-05 NOTE — Telephone Encounter (Signed)
Left message [facility] - Records to mail a copy of the MRI copied from the machine in diacom form to our office.

## 2017-11-11 NOTE — Telephone Encounter (Signed)
Mailed a copy of MRI disc to [facility].

## 2017-11-25 NOTE — Telephone Encounter (Signed)
Left message for pt to call for an appt to discuss final MRI results with Dr. Al CorpusHyatt.

## 2017-11-25 NOTE — Telephone Encounter (Signed)
Jenna LeeSabrina - [facility] states reports are ready and will fax to our office.

## 2017-11-26 ENCOUNTER — Encounter: Payer: Self-pay | Admitting: Podiatry

## 2017-11-26 ENCOUNTER — Telehealth: Payer: Self-pay | Admitting: Podiatry

## 2017-11-26 NOTE — Telephone Encounter (Signed)
I informed pt I had left a message for her to make an appt to discuss her MRI results. Transferred pt to scheduler.

## 2017-11-26 NOTE — Telephone Encounter (Signed)
I'm returning a call to Ms. Jenna Yoder. I can be reached at [phone]. Thank you.

## 2017-12-01 ENCOUNTER — Ambulatory Visit (INDEPENDENT_AMBULATORY_CARE_PROVIDER_SITE_OTHER): Payer: BLUE CROSS/BLUE SHIELD | Admitting: Podiatry

## 2017-12-01 ENCOUNTER — Encounter: Payer: Self-pay | Admitting: Podiatry

## 2017-12-01 DIAGNOSIS — M779 Enthesopathy, unspecified: Secondary | ICD-10-CM

## 2017-12-01 DIAGNOSIS — Q688 Other specified congenital musculoskeletal deformities: Secondary | ICD-10-CM

## 2017-12-01 DIAGNOSIS — M7752 Other enthesopathy of left foot: Secondary | ICD-10-CM

## 2017-12-02 NOTE — Progress Notes (Signed)
She presents today for follow-up of her MRI report.  States that her left ankle is still killing her.  Objective: Vital signs are stable she is alert and oriented x3.  She has strong palpable pulses.  She has pain on range of motion of the hallux posteriorly she also has pain with plantarflexion of the ankle joint.  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  Plan: After sterile Betadine skin prep I injected 20 mg Kenalog 5 mg Marcaine and posterior aspect of the foot.  She states that it felt better before she was leaving.  If this reoccurs surgery will be considered.

## 2018-01-14 ENCOUNTER — Ambulatory Visit: Payer: BLUE CROSS/BLUE SHIELD | Admitting: Podiatry

## 2018-01-28 ENCOUNTER — Encounter (INDEPENDENT_AMBULATORY_CARE_PROVIDER_SITE_OTHER): Payer: BLUE CROSS/BLUE SHIELD | Admitting: Podiatry

## 2018-01-28 NOTE — Progress Notes (Signed)
This encounter was created in error - please disregard.

## 2018-02-18 ENCOUNTER — Ambulatory Visit (INDEPENDENT_AMBULATORY_CARE_PROVIDER_SITE_OTHER): Payer: BLUE CROSS/BLUE SHIELD | Admitting: Podiatry

## 2018-02-18 DIAGNOSIS — M7672 Peroneal tendinitis, left leg: Secondary | ICD-10-CM | POA: Diagnosis not present

## 2018-02-18 DIAGNOSIS — Q688 Other specified congenital musculoskeletal deformities: Secondary | ICD-10-CM

## 2018-02-18 NOTE — Progress Notes (Signed)
She presents today for follow-up of her os trigonum syndrome.  She states that the injection worked very well and worked really well for about 3 weeks or so.  She states that now starting to hurt out here she refers to the lateral peroneal tendon area.  Objective: Vital signs are stable she is alert and oriented x3 still has pain on deep palpation of the posterior subtalar joint area but most recently has now developed some tenderness along the peroneal tendons.  She states that she feels that she may be walking to the outside of the foot which is causing this strain of the peroneals.  Assessment: Peroneal tendinitis and os trigonum syndrome.  Plan: Extra Betadine skin prep injected 10 mg of Kenalog 5 mg Marcaine to the peroneal tendon sheaths of the left foot.  I will follow-up with her in 3 to 4 weeks make sure she is doing well at which time we will consider surgical intervention of the os trigonum if necessary.

## 2018-03-18 ENCOUNTER — Ambulatory Visit (INDEPENDENT_AMBULATORY_CARE_PROVIDER_SITE_OTHER): Payer: BLUE CROSS/BLUE SHIELD | Admitting: Podiatry

## 2018-03-18 DIAGNOSIS — M7672 Peroneal tendinitis, left leg: Secondary | ICD-10-CM

## 2018-03-18 DIAGNOSIS — Q688 Other specified congenital musculoskeletal deformities: Secondary | ICD-10-CM | POA: Diagnosis not present

## 2018-03-18 NOTE — Progress Notes (Signed)
She presents today for follow-up of the os trigonum syndrome and peroneal tendinitis she states that is doing 100% better there is no pain whatsoever at this point.  Objective: Vital signs are stable she is alert and oriented x3 there is no erythema edema cellulitis drainage or odor no reproducible pain on maximum plantarflexion of the foot at the ankle and no pain on dorsiflexion of the hallux she also has no pain on palpation of the peroneal tendons left.  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  Plan: Follow-up with me on as-needed basis.

## 2018-06-17 ENCOUNTER — Other Ambulatory Visit: Payer: Self-pay

## 2018-06-17 ENCOUNTER — Ambulatory Visit (INDEPENDENT_AMBULATORY_CARE_PROVIDER_SITE_OTHER): Payer: BLUE CROSS/BLUE SHIELD | Admitting: Podiatry

## 2018-06-17 ENCOUNTER — Ambulatory Visit: Payer: BLUE CROSS/BLUE SHIELD | Admitting: Podiatry

## 2018-06-17 ENCOUNTER — Encounter: Payer: Self-pay | Admitting: Podiatry

## 2018-06-17 DIAGNOSIS — Q688 Other specified congenital musculoskeletal deformities: Secondary | ICD-10-CM

## 2018-06-17 DIAGNOSIS — M7672 Peroneal tendinitis, left leg: Secondary | ICD-10-CM | POA: Diagnosis not present

## 2018-06-17 DIAGNOSIS — M7752 Other enthesopathy of left foot: Secondary | ICD-10-CM | POA: Diagnosis not present

## 2018-06-17 MED ORDER — METHYLPREDNISOLONE 4 MG PO TBPK: ORAL_TABLET | ORAL | 0 refills | Status: DC

## 2018-06-22 ENCOUNTER — Encounter: Payer: Self-pay | Admitting: Podiatry

## 2018-06-22 NOTE — Progress Notes (Signed)
She presents today for follow-up of her peroneal tendinitis and os trigonum syndrome of her left foot.  States that she is it was doing just great and then all of a sudden started to bother me over the past few weeks.  It is really been hurting a lot she said and states that she had to wear her boot some.  States that it may have flared up again because of certain shoes that she wore.  Objective: Vital signs are stable she is alert and oriented x3 has fluid on palpation of the peroneal tendons just beneath the lateral malleolus.  She also has tenderness on sharp plantarflexion of the foot at the level of the posterior ankle.  Assessment: Os trigonum syndrome and peroneal tendinitis.  Plan: At this point I injected 2 injections 120 mg of Kenalog with 5 mg of Marcaine to the posterior subtalar joint area deep around the os trigonum.  Also injected 10 mg of Kenalog with 5 mg of Marcaine to the inferior aspect of the lateral malleolus and the sheath of the peroneal tendons.  I will follow-up with her in 1 month may need to consider new imaging to compare to the old imaging.

## 2018-07-22 ENCOUNTER — Other Ambulatory Visit: Payer: Self-pay

## 2018-07-22 ENCOUNTER — Ambulatory Visit: Payer: BLUE CROSS/BLUE SHIELD | Admitting: Podiatry

## 2018-07-22 ENCOUNTER — Encounter: Payer: Self-pay | Admitting: Podiatry

## 2018-07-22 VITALS — Temp 97.5°F

## 2018-07-22 DIAGNOSIS — M7672 Peroneal tendinitis, left leg: Secondary | ICD-10-CM | POA: Diagnosis not present

## 2018-07-22 DIAGNOSIS — Q688 Other specified congenital musculoskeletal deformities: Secondary | ICD-10-CM

## 2018-07-22 NOTE — Progress Notes (Signed)
She presents today for follow-up of her peroneal tendinitis and os trigonum.  States that she was doing very well and it just has started to creep back over the last couple of days.  She states that it is a lot better though.  Objective: Vital signs are stable alert and oriented x3.  She has pain on deep palpation of the posterior aspect of the ankle around the posterior subtalar joint facet and she also has tenderness on palpation of the peroneal tendons.  Assessment: Peroneal tendinitis os trigonum.  Plan: Discussed etiology pathology conservative therapies at this point time she would like to have another injection.  I injected both areas today utilizing 20 mg of Kenalog 5 mg of Marcaine divided amongst the 2 sites.  This was performed after the area was cleaned with Betadine.  Follow-up with her in 1 month or 6 weeks

## 2018-09-02 ENCOUNTER — Ambulatory Visit: Payer: BC Managed Care – PPO | Admitting: Podiatry

## 2018-10-21 ENCOUNTER — Ambulatory Visit (INDEPENDENT_AMBULATORY_CARE_PROVIDER_SITE_OTHER): Payer: BC Managed Care – PPO | Admitting: Podiatry

## 2018-10-21 ENCOUNTER — Encounter: Payer: Self-pay | Admitting: Podiatry

## 2018-10-21 ENCOUNTER — Other Ambulatory Visit: Payer: Self-pay

## 2018-10-21 VITALS — Temp 97.9°F

## 2018-10-21 DIAGNOSIS — M7752 Other enthesopathy of left foot: Secondary | ICD-10-CM | POA: Diagnosis not present

## 2018-10-21 NOTE — Progress Notes (Signed)
She presents today chief complaint of pain to the sinus tarsi area as she points to it.  States that the peroneal tendons are doing fine and os trigonum is doing fine.  Objective: Vital signs are stable alert and oriented x3.  Pulses are palpable.  She has pain on palpation of the sinus tarsi and on end range of motion of the subtalar joint left.  No pain on palpation of the peroneal tendons.  No pain on palpation of the os trigonum.  Assessment: Well-healing os trigonum syndrome well-healing peroneal tendinitis subtalar joint capsulitis currently left.  Plan: Discussed etiology pathology and surgical therapies at this point time extubated skin prep and injected 4 mg of dexamethasone and local anesthetic to the point of maximal tenderness making sure to injected into the subtalar joint itself.

## 2018-11-04 ENCOUNTER — Encounter: Payer: Self-pay | Admitting: Podiatry

## 2018-11-04 ENCOUNTER — Ambulatory Visit: Payer: BC Managed Care – PPO | Admitting: Podiatry

## 2018-11-04 ENCOUNTER — Other Ambulatory Visit: Payer: Self-pay

## 2018-11-04 ENCOUNTER — Telehealth: Payer: Self-pay | Admitting: *Deleted

## 2018-11-04 DIAGNOSIS — Q688 Other specified congenital musculoskeletal deformities: Secondary | ICD-10-CM

## 2018-11-04 DIAGNOSIS — M7672 Peroneal tendinitis, left leg: Secondary | ICD-10-CM

## 2018-11-04 DIAGNOSIS — M722 Plantar fascial fibromatosis: Secondary | ICD-10-CM

## 2018-11-04 DIAGNOSIS — M7752 Other enthesopathy of left foot: Secondary | ICD-10-CM | POA: Diagnosis not present

## 2018-11-04 MED ORDER — METHYLPREDNISOLONE 4 MG PO TBPK: ORAL_TABLET | ORAL | 0 refills | Status: DC

## 2018-11-04 NOTE — Telephone Encounter (Signed)
-----   Message from Rip Harbour, [facility] sent at 11/04/2018  9:14 AM EDT ----- Regarding: MRI MRI left ankle - evaluate peroneal tendon tear, plantar fasciitis, subtalar joint capsulitis, os trigonum syndrome left - surgical consideration

## 2018-11-04 NOTE — Telephone Encounter (Signed)
Faxed orders to [facility]. 

## 2018-11-06 ENCOUNTER — Encounter: Payer: Self-pay | Admitting: Podiatry

## 2018-11-06 NOTE — Progress Notes (Signed)
She presents today for follow-up of her peroneal tendinitis and subtalar joint capsulitis as well as os trigonum syndrome left foot.  States that after the last sinus tarsi injection my foot just hurts more now than it really ever has.  She denies any trauma.  Objective: Vital signs are stable alert and oriented x3.  Pulses are palpable.  She has pain on palpation of the sinus tarsi left pain on inversion and eversion of the subtalar joint she has pain on palpation of the medial calcaneal tubercle of the left heel.  She still has pain on eversion against resistance and abduction against resistance.  No tenderness of the posterior tibial tendon no tenderness of the Achilles.  She does have pain on deep palpation of the posterior subtalar joint as well as on plantarflexion forced.  Assessment: She has plantar fasciitis with subtalar joint capsulitis peroneal tendinitis cannot rule out a tear here.  We know that she has os trigonum syndrome which may be getting worse and may be causing some of her flexor hallucis tendinitis as well.  Plan: At this point I injected her left heel today placed her in a cam walker which she already had at home she needs a note to work in the boot.  And also prescribed methylprednisolone.  We will request an MRI of the rear foot just to evaluate for tears.

## 2018-11-25 ENCOUNTER — Other Ambulatory Visit: Payer: Self-pay | Admitting: Podiatry

## 2018-11-28 ENCOUNTER — Other Ambulatory Visit: Payer: BLUE CROSS/BLUE SHIELD

## 2018-12-09 ENCOUNTER — Other Ambulatory Visit: Payer: Self-pay

## 2018-12-09 ENCOUNTER — Ambulatory Visit: Payer: BC Managed Care – PPO | Admitting: Family Medicine

## 2018-12-09 ENCOUNTER — Encounter: Payer: Self-pay | Admitting: Family Medicine

## 2018-12-09 VITALS — BP 131/84 | HR 83 | Temp 98.3°F | Ht 67.0 in | Wt 196.6 lb

## 2018-12-09 DIAGNOSIS — K219 Gastro-esophageal reflux disease without esophagitis: Secondary | ICD-10-CM

## 2018-12-09 DIAGNOSIS — Z8249 Family history of ischemic heart disease and other diseases of the circulatory system: Secondary | ICD-10-CM

## 2018-12-09 DIAGNOSIS — R030 Elevated blood-pressure reading, without diagnosis of hypertension: Secondary | ICD-10-CM | POA: Diagnosis not present

## 2018-12-09 DIAGNOSIS — Z800 Family history of malignant neoplasm of digestive organs: Secondary | ICD-10-CM | POA: Diagnosis not present

## 2018-12-09 MED ORDER — OMEPRAZOLE 20 MG PO CPDR: 20.0000 mg | DELAYED_RELEASE_CAPSULE | Freq: Two times a day (BID) | ORAL | 3 refills | Status: DC

## 2018-12-09 NOTE — Progress Notes (Signed)
10/1/202011:01 AM  Jenna Yoder 31-Jan-1976, 42 y.o., female [id number]  Chief Complaint  Patient presents with  . Establish Care    was taking the bp med but stopped due to normal bp reads, now seeing an increase    HPI:   Patient is a 43 y.o. female with past medical history significant for HTN who presents today to establish care  Patient had been diagnosed with HTN several years ago Prescribed amlodipine 54m and HCTZ 12.53mmeds were stopped about 2 years ago However recently (after the death of her son in [id number] has been creeping up Has been eating low salt diet When she checks BP at home, 130/80s She feels her BP raise when she has trouble with swallowing Feels food gets stuck in mid chest, mostly meat No abd pain, she is having reflux and belching, no vomiting, no diarrhea or constipation, no black tarry stools or bright red stools Better if does not eat late at night Has been losing weight, unintentional, scared to eat as she cant swallow well She thinks mom or dad had colon cancer Mom died of MI at age 41-53ad died of CVA at age 3635er Step-brother (maternal) died of colon cancer at age 622on smoker No frequent NSAID use Does not drink caffeine Has not taken any medications for heartburn  Currently healing for a left ankle fracture, wearing walking boot Podiatry, Dr Hy[facility]inda [facility], [place]  [facility] 12/09/2018  Decreased Interest 0  Down, Depressed, Hopeless 0  PHQ - 2 Score 0    Fall Risk  12/09/2018  Falls in the past year? 0  Number falls in past yr: 0  Injury with Fall? 0     No Known Allergies  Prior to Admission medications   Not on File    Past Medical History:  Diagnosis Date  . Dentalgia   . HTN (hypertension)   . UTI (lower urinary tract infection)     Past Surgical History:  Procedure Laterality Date  . CESAREAN SECTION    . TUBAL LIGATION       Social History   Tobacco Use  . Smoking status: Never Smoker  . Smokeless tobacco: Never Used  Substance Use Topics  . Alcohol use: Yes    Comment: occ    Family History  Problem Relation Age of Onset  . Diabetes Mother   . Hypertension Mother   . Diabetes Sister   . Hypertension Brother     Review of Systems  Constitutional: Negative for chills and fever.  Respiratory: Negative for cough and shortness of breath.   Cardiovascular: Negative for chest pain, palpitations and leg swelling.  All other systems reviewed and are negative. per hpi   OBJECTIVE:  Today's Vitals   12/09/18 1053  BP: 131/84  Pulse: 83  Temp: 98.3 F (36.8 C)  SpO2: 100%  Weight: 196 lb 9.6 oz (89.2 kg)  Height: '5\' 7"'  (1.702 m)   Body mass index is 30.79 kg/m.   Physical Exam Vitals signs and nursing note reviewed.  Constitutional:      Appearance: She is well-developed.  HENT:     Head: Normocephalic and atraumatic.     Right Ear: Hearing, tympanic membrane, ear canal and external ear normal.     Left Ear: Hearing, tympanic membrane, ear canal and external ear normal.     Mouth/Throat:     Mouth: Mucous  membranes are moist.     Pharynx: No oropharyngeal exudate or posterior oropharyngeal erythema.  Eyes:     Extraocular Movements: Extraocular movements intact.     Conjunctiva/sclera: Conjunctivae normal.     Pupils: Pupils are equal, round, and reactive to light.  Neck:     Musculoskeletal: Neck supple.     Thyroid: No thyromegaly.  Cardiovascular:     Rate and Rhythm: Normal rate and regular rhythm.     Heart sounds: Normal heart sounds. No murmur. No friction rub. No gallop.   Pulmonary:     Effort: Pulmonary effort is normal.     Breath sounds: Normal breath sounds. No wheezing, rhonchi or rales.  Abdominal:     General: Bowel sounds are normal. There is no distension.     Palpations: Abdomen is soft. There is no hepatomegaly, splenomegaly or mass.     Tenderness: There is  no abdominal tenderness.  Musculoskeletal: Normal range of motion.     Right lower leg: No edema.     Left lower leg: No edema.  Lymphadenopathy:     Cervical: No cervical adenopathy.  Skin:    General: Skin is warm and dry.  Neurological:     Mental Status: She is alert and oriented to person, place, and time.     Cranial Nerves: No cranial nerve deficit.     Gait: Gait normal.     Deep Tendon Reflexes: Reflexes are normal and symmetric.  Psychiatric:        Mood and Affect: Mood normal.        Behavior: Behavior normal.     No results found for this or any previous visit (from the past 24 hour(s)).  No results found.   ASSESSMENT and PLAN  1. Gastroesophageal reflux disease, unspecified whether esophagitis present Discussed supportive measures, new meds r/se/b and RTC precautions. Patient educational handout given. If not resolved, refer to GI - CBC  2. Elevated BP without diagnosis of hypertension Cont home bp monitoring and LFM - TSH - CMP14+EGFR  3. FHx: heart disease - Lipid panel  4. FHx: colon cancer Start colonoscopy at age 86  Other orders - omeprazole (PRILOSEC) 20 MG capsule; Take 1 capsule (20 mg total) by mouth 2 (two) times daily before a meal.  Return in about 4 weeks (around 01/06/2019).    Rutherford Guys, MD Primary Care at [place], [place] [address] Ph.  [phone] Fax [phone]

## 2018-12-09 NOTE — Patient Instructions (Addendum)
   If you have lab work done today you will be contacted with your lab results within the next 2 weeks.  If you have not heard from us then please contact us. The fastest way to get your results is to register for My Chart.   IF you received an x-ray today, you will receive an invoice from [facility]. Please contact [facility] at [phone] with questions or concerns regarding your invoice.   IF you received labwork today, you will receive an invoice from [facility]. Please contact [facility] at [phone] with questions or concerns regarding your invoice.   Our billing staff will not be able to assist you with questions regarding bills from these companies.  You will be contacted with the lab results as soon as they are available. The fastest way to get your results is to activate your My Chart account. Instructions are located on the last page of this paperwork. If you have not heard from us regarding the results in 2 weeks, please contact this office.     Food Choices for Gastroesophageal Reflux Disease, Adult When you have gastroesophageal reflux disease (GERD), the foods you eat and your eating habits are very important. Choosing the right foods can help ease the discomfort of GERD. Consider working with a diet and nutrition specialist (dietitian) to help you make healthy food choices. What general guidelines should I follow?  Eating plan  Choose healthy foods low in fat, such as fruits, vegetables, whole grains, low-fat dairy products, and lean meat, fish, and poultry.  Eat frequent, small meals instead of three large meals each day. Eat your meals slowly, in a relaxed setting. Avoid bending over or lying down until 2-3 hours after eating.  Limit high-fat foods such as fatty meats or fried foods.  Limit your intake of oils, butter, and shortening to less than 8 teaspoons each day.  Avoid the following: ? Foods that cause symptoms. These may be different for  different people. Keep a food diary to keep track of foods that cause symptoms. ? Alcohol. ? Drinking large amounts of liquid with meals. ? Eating meals during the 2-3 hours before bed.  Cook foods using methods other than frying. This may include baking, grilling, or broiling. Lifestyle  Maintain a healthy weight. Ask your health care provider what weight is healthy for you. If you need to lose weight, work with your health care provider to do so safely.  Exercise for at least 30 minutes on 5 or more days each week, or as told by your health care provider.  Avoid wearing clothes that fit tightly around your waist and chest.  Do not use any products that contain nicotine or tobacco, such as cigarettes and e-cigarettes. If you need help quitting, ask your health care provider.  Sleep with the head of your bed raised. Use a wedge under the mattress or blocks under the bed frame to raise the head of the bed. What foods are not recommended? The items listed may not be a complete list. Talk with your dietitian about what dietary choices are best for you. Grains Pastries or quick breads with added fat. French toast. Vegetables Deep fried vegetables. French fries. Any vegetables prepared with added fat. Any vegetables that cause symptoms. For some people this may include tomatoes and tomato products, chili peppers, onions and garlic, and horseradish. Fruits Any fruits prepared with added fat. Any fruits that cause symptoms. For some people this may include citrus fruits, such as oranges,   grapefruit, pineapple, and lemons. Meats and other protein foods High-fat meats, such as fatty beef or pork, hot dogs, ribs, ham, sausage, salami and bacon. Fried meat or protein, including fried fish and fried chicken. Nuts and nut butters. Dairy Whole milk and chocolate milk. Sour cream. Cream. Ice cream. Cream cheese. Milk shakes. Beverages Coffee and tea, with or without caffeine. Carbonated beverages.  Sodas. Energy drinks. Fruit juice made with acidic fruits (such as orange or grapefruit). Tomato juice. Alcoholic drinks. Fats and oils Butter. Margarine. Shortening. Ghee. Sweets and desserts Chocolate and cocoa. Donuts. Seasoning and other foods Pepper. Peppermint and spearmint. Any condiments, herbs, or seasonings that cause symptoms. For some people, this may include curry, hot sauce, or vinegar-based salad dressings. Summary  When you have gastroesophageal reflux disease (GERD), food and lifestyle choices are very important to help ease the discomfort of GERD.  Eat frequent, small meals instead of three large meals each day. Eat your meals slowly, in a relaxed setting. Avoid bending over or lying down until 2-3 hours after eating.  Limit high-fat foods such as fatty meat or fried foods. This information is not intended to replace advice given to you by your health care provider. Make sure you discuss any questions you have with your health care provider. Document Released: 02/24/2005 Document Revised: 06/17/2018 Document Reviewed: 02/26/2016 Elsevier Patient Education  2020 Elsevier Inc.  

## 2018-12-10 ENCOUNTER — Other Ambulatory Visit: Payer: BLUE CROSS/BLUE SHIELD

## 2018-12-10 LAB — CBC
Hematocrit: 43.5 % (ref 34.0–46.6)
Hemoglobin: 14 g/dL (ref 11.1–15.9)
MCH: 27.3 pg (ref 26.6–33.0)
MCHC: 32.2 g/dL (ref 31.5–35.7)
MCV: 85 fL (ref 79–97)
Platelets: 251 10*3/uL (ref 150–450)
RBC: 5.12 x10E6/uL (ref 3.77–5.28)
RDW: 15 % (ref 11.7–15.4)
WBC: 4.6 10*3/uL (ref 3.4–10.8)

## 2018-12-10 LAB — CMP14+EGFR
ALT: 10 IU/L (ref 0–32)
AST: 16 IU/L (ref 0–40)
Albumin/Globulin Ratio: 1.3 (ref 1.2–2.2)
Albumin: 4.6 g/dL (ref 3.8–4.8)
Alkaline Phosphatase: 66 IU/L (ref 39–117)
BUN/Creatinine Ratio: 8 — ABNORMAL LOW (ref 9–23)
BUN: 6 mg/dL (ref 6–24)
Bilirubin Total: 0.4 mg/dL (ref 0.0–1.2)
CO2: 22 mmol/L (ref 20–29)
Calcium: 10.6 mg/dL — ABNORMAL HIGH (ref 8.7–10.2)
Chloride: 100 mmol/L (ref 96–106)
Creatinine, Ser: 0.76 mg/dL (ref 0.57–1.00)
GFR calc Af Amer: 113 mL/min/{1.73_m2} (ref >59)
GFR calc non Af Amer: 98 mL/min/{1.73_m2} (ref >59)
Globulin, Total: 3.5 g/dL (ref 1.5–4.5)
Glucose: 93 mg/dL (ref 65–99)
Potassium: 4.1 mmol/L (ref 3.5–5.2)
Sodium: 138 mmol/L (ref 134–144)
Total Protein: 8.1 g/dL (ref 6.0–8.5)

## 2018-12-10 LAB — TSH: TSH: 0.53 u[IU]/mL (ref 0.450–4.500)

## 2018-12-10 LAB — LIPID PANEL
Chol/HDL Ratio: 2.8 ratio (ref 0.0–4.4)
Cholesterol, Total: 254 mg/dL — ABNORMAL HIGH (ref 100–199)
HDL: 90 mg/dL (ref >39)
LDL Chol Calc (NIH): 156 mg/dL — ABNORMAL HIGH (ref 0–99)
Triglycerides: 54 mg/dL (ref 0–149)
VLDL Cholesterol Cal: 8 mg/dL (ref 5–40)

## 2018-12-14 ENCOUNTER — Other Ambulatory Visit: Payer: Self-pay

## 2018-12-14 ENCOUNTER — Ambulatory Visit
Admission: RE | Admit: 2018-12-14 | Discharge: 2018-12-14 | Disposition: A | Discharge status: Home / Self Care | Payer: BC Managed Care – PPO | Source: Ambulatory Visit | Attending: Podiatry | Admitting: Podiatry

## 2018-12-21 ENCOUNTER — Encounter: Payer: Self-pay | Admitting: Radiology

## 2018-12-21 ENCOUNTER — Telehealth: Payer: Self-pay | Admitting: *Deleted

## 2018-12-21 NOTE — Telephone Encounter (Signed)
I informed pt of Dr. Stephenie Acres request to send copy of MRI disc to a radiology specialist for more details for treatment planning and there would be a 10-14 day delay in final results, once received we would call with instructions. Mailed copy of disc to [facility].

## 2018-12-21 NOTE — Telephone Encounter (Signed)
-----   Message from Garrel Ridgel, [place] sent at 12/20/2018  6:56 AM EDT ----- Send for over read and inform patient of the delay.

## 2018-12-23 NOTE — Telephone Encounter (Signed)
Mailing copy of MRi

## 2019-01-04 ENCOUNTER — Encounter: Payer: Self-pay | Admitting: Podiatry

## 2019-01-06 ENCOUNTER — Encounter: Payer: Self-pay | Admitting: Family Medicine

## 2019-01-06 ENCOUNTER — Ambulatory Visit (INDEPENDENT_AMBULATORY_CARE_PROVIDER_SITE_OTHER): Payer: BC Managed Care – PPO | Admitting: Family Medicine

## 2019-01-06 ENCOUNTER — Other Ambulatory Visit: Payer: Self-pay

## 2019-01-06 VITALS — BP 122/86 | HR 92 | Temp 98.1°F | Wt 193.2 lb

## 2019-01-06 DIAGNOSIS — I10 Essential (primary) hypertension: Secondary | ICD-10-CM

## 2019-01-06 DIAGNOSIS — E7800 Pure hypercholesterolemia, unspecified: Secondary | ICD-10-CM

## 2019-01-06 DIAGNOSIS — K219 Gastro-esophageal reflux disease without esophagitis: Secondary | ICD-10-CM | POA: Diagnosis not present

## 2019-01-06 NOTE — Progress Notes (Signed)
   10/29/202010:50 AM  Jenna Yoder Dec 10, 1975, 43 y.o., female [id number]  No chief complaint on file.   HPI:   Patient is a 43 y.o. female with past medical history significant for HTN and GERD, fhx colon cancer who presents today for BP check  Last OV Oct 2020 Used to be on BP meds - taken off after LFM, which had stopped due to life stressors BP at home had been ~ 130s/80s Discussed LFM Started on PPI for gerd Labs done mild hypercalcemia and elevated LDL of 156  She is overall doing ok She had not been checking BP at home GERD better with PPI She received note re labs She did stop taking Ca supplements  Depression screen PHQ 2/9 12/09/2018  Decreased Interest 0  Down, Depressed, Hopeless 0  PHQ - 2 Score 0    Fall Risk  12/09/2018  Falls in the past year? 0  Number falls in past yr: 0  Injury with Fall? 0     No Known Allergies  Prior to Admission medications   Medication Sig Start Date End Date Taking? Authorizing Provider  omeprazole (PRILOSEC) 20 MG capsule Take 1 capsule (20 mg total) by mouth 2 (two) times daily before a meal. 12/09/18   Rutherford Guys, MD    Past Medical History:  Diagnosis Date  . Dentalgia   . HTN (hypertension)   . UTI (lower urinary tract infection)     Past Surgical History:  Procedure Laterality Date  . CESAREAN SECTION    . TUBAL LIGATION      Social History   Tobacco Use  . Smoking status: Never Smoker  . Smokeless tobacco: Never Used  Substance Use Topics  . Alcohol use: Yes    Comment: occ    Family History  Problem Relation Age of Onset  . Diabetes Mother   . Hypertension Mother   . Diabetes Sister   . Hypertension Brother     ROS Per hpi  OBJECTIVE:  Today's Vitals   01/06/19 1100  BP: 122/86  Pulse: 92  Temp: 98.1 F (36.7 C)  Weight: 193 lb 3.2 oz (87.6 kg)   Body mass index is 30.26 kg/m.   Physical Exam Vitals signs and nursing note reviewed.  Constitutional:      Appearance:  She is well-developed.  HENT:     Head: Normocephalic and atraumatic.  Eyes:     General: No scleral icterus.    Conjunctiva/sclera: Conjunctivae normal.     Pupils: Pupils are equal, round, and reactive to light.  Neck:     Musculoskeletal: Neck supple.  Pulmonary:     Effort: Pulmonary effort is normal.  Skin:    General: Skin is warm and dry.  Neurological:     Mental Status: She is alert and oriented to person, place, and time.     No results found for this or any previous visit (from the past 24 hour(s)).  No results found.   ASSESSMENT and PLAN  1. Gastroesophageal reflux disease, unspecified whether esophagitis present Controlled. Continue current regime.   2. Essential hypertension Controlled with diet  3. Hypercalcemia - Calcium, ionized  4. Pure hypercholesterolemia Discussed LFM The 10-year ASCVD risk score Mikey Bussing DC Jr., et al., 2013) is: 0.2%  Return in about 1 year (around 01/06/2020) for CPE.    Rutherford Guys, MD Primary Care at [place], [place] [address] Ph.  [phone] Fax [phone]

## 2019-01-06 NOTE — Patient Instructions (Signed)
Fat and Cholesterol Restricted Eating Plan Getting too much fat and cholesterol in your diet may cause health problems. Choosing the right foods helps keep your fat and cholesterol at normal levels. This can keep you from getting certain diseases. Your doctor may recommend an eating plan that includes:  Total fat: ______% or less of total calories a day.  Saturated fat: ______% or less of total calories a day.  Cholesterol: less than _________mg a day.  Fiber: ______g a day. What are tips for following this plan? Meal planning  At meals, divide your plate into four equal parts: ? Fill one-half of your plate with vegetables and green salads. ? Fill one-fourth of your plate with whole grains. ? Fill one-fourth of your plate with low-fat (lean) protein foods.  Eat fish that is high in omega-3 fats at least two times a week. This includes mackerel, tuna, sardines, and salmon.  Eat foods that are high in fiber, such as whole grains, beans, apples, broccoli, carrots, peas, and barley. General tips   Work with your doctor to lose weight if you need to.  Avoid: ? Foods with added sugar. ? Fried foods. ? Foods with partially hydrogenated oils.  Limit alcohol intake to no more than 1 drink a day for nonpregnant women and 2 drinks a day for men. One drink equals 12 oz of beer, 5 oz of wine, or 1 oz of hard liquor. Reading food labels  Check food labels for: ? Trans fats. ? Partially hydrogenated oils. ? Saturated fat (g) in each serving. ? Cholesterol (mg) in each serving. ? Fiber (g) in each serving.  Choose foods with healthy fats, such as: ? Monounsaturated fats. ? Polyunsaturated fats. ? Omega-3 fats.  Choose grain products that have whole grains. Look for the word "whole" as the first word in the ingredient list. Cooking  Cook foods using low-fat methods. These include baking, boiling, grilling, and broiling.  Eat more home-cooked foods. Eat at restaurants and buffets  less often.  Avoid cooking using saturated fats, such as butter, cream, palm oil, palm kernel oil, and coconut oil. Recommended foods  Fruits  All fresh, canned (in natural juice), or frozen fruits. Vegetables  Fresh or frozen vegetables (raw, steamed, roasted, or grilled). Green salads. Grains  Whole grains, such as whole wheat or whole grain breads, crackers, cereals, and pasta. Unsweetened oatmeal, bulgur, barley, quinoa, or brown rice. Corn or whole wheat flour tortillas. Meats and other protein foods  Ground beef (85% or leaner), grass-fed beef, or beef trimmed of fat. Skinless chicken or turkey. Ground chicken or turkey. Pork trimmed of fat. All fish and seafood. Egg whites. Dried beans, peas, or lentils. Unsalted nuts or seeds. Unsalted canned beans. Nut butters without added sugar or oil. Dairy  Low-fat or nonfat dairy products, such as skim or 1% milk, 2% or reduced-fat cheeses, low-fat and fat-free ricotta or cottage cheese, or plain low-fat and nonfat yogurt. Fats and oils  Tub margarine without trans fats. Light or reduced-fat mayonnaise and salad dressings. Avocado. Olive, canola, sesame, or safflower oils. The items listed above may not be a complete list of foods and beverages you can eat. Contact a dietitian for more information. Foods to avoid Fruits  Canned fruit in heavy syrup. Fruit in cream or butter sauce. Fried fruit. Vegetables  Vegetables cooked in cheese, cream, or butter sauce. Fried vegetables. Grains  White bread. White pasta. White rice. Cornbread. Bagels, pastries, and croissants. Crackers and snack foods that contain trans fat   and hydrogenated oils. Meats and other protein foods  Fatty cuts of meat. Ribs, chicken wings, bacon, sausage, bologna, salami, chitterlings, fatback, hot dogs, bratwurst, and packaged lunch meats. Liver and organ meats. Whole eggs and egg yolks. Chicken and turkey with skin. Fried meat. Dairy  Whole or 2% milk, cream,  half-and-half, and cream cheese. Whole milk cheeses. Whole-fat or sweetened yogurt. Full-fat cheeses. Nondairy creamers and whipped toppings. Processed cheese, cheese spreads, and cheese curds. Beverages  Alcohol. Sugar-sweetened drinks such as sodas, lemonade, and fruit drinks. Fats and oils  Butter, stick margarine, lard, shortening, ghee, or bacon fat. Coconut, palm kernel, and palm oils. Sweets and desserts  Corn syrup, sugars, honey, and molasses. Candy. Jam and jelly. Syrup. Sweetened cereals. Cookies, pies, cakes, donuts, muffins, and ice cream. The items listed above may not be a complete list of foods and beverages you should avoid. Contact a dietitian for more information. Summary  Choosing the right foods helps keep your fat and cholesterol at normal levels. This can keep you from getting certain diseases.  At meals, fill one-half of your plate with vegetables and green salads.  Eat high-fiber foods, like whole grains, beans, apples, carrots, peas, and barley.  Limit added sugar, saturated fats, alcohol, and fried foods. This information is not intended to replace advice given to you by your health care provider. Make sure you discuss any questions you have with your health care provider. Document Released: 08/26/2011 Document Revised: 10/28/2017 Document Reviewed: 11/11/2016 Elsevier Patient Education  2020 Elsevier Inc.  

## 2019-01-07 LAB — CALCIUM, IONIZED: Calcium, Ion: 5.3 mg/dL (ref 4.5–5.6)

## 2019-01-25 ENCOUNTER — Telehealth: Payer: Self-pay | Admitting: *Deleted

## 2019-01-25 ENCOUNTER — Other Ambulatory Visit: Payer: Self-pay

## 2019-01-25 ENCOUNTER — Ambulatory Visit: Payer: BC Managed Care – PPO | Admitting: Podiatry

## 2019-01-25 DIAGNOSIS — M722 Plantar fascial fibromatosis: Secondary | ICD-10-CM

## 2019-01-25 DIAGNOSIS — M7672 Peroneal tendinitis, left leg: Secondary | ICD-10-CM

## 2019-01-25 DIAGNOSIS — Q688 Other specified congenital musculoskeletal deformities: Secondary | ICD-10-CM | POA: Diagnosis not present

## 2019-01-25 NOTE — Patient Instructions (Signed)
Pre-Operative Instructions  Congratulations, you have decided to take an important step towards improving your quality of life.  You can be assured that the doctors and staff at [facility] will be with you every step of the way.  Here are some important things you should know:  1. Plan to be at the surgery center/hospital at least 1 (one) hour prior to your scheduled time, unless otherwise directed by the surgical center/hospital staff.  You must have a responsible adult accompany you, remain during the surgery and drive you home.  Make sure you have directions to the surgical center/hospital to ensure you arrive on time. 2. If you are having surgery at [facility] or  hospitals, you will need a copy of your medical history and physical form from your family physician within one month prior to the date of surgery. We will give you a form for your primary physician to complete.  3. We make every effort to accommodate the date you request for surgery.  However, there are times where surgery dates or times have to be moved.  We will contact you as soon as possible if a change in schedule is required.   4. No aspirin/ibuprofen for one week before surgery.  If you are on aspirin, any non-steroidal anti-inflammatory medications (Mobic, Aleve, Ibuprofen) should not be taken seven (7) days prior to your surgery.  You make take Tylenol for pain prior to surgery.  5. Medications - If you are taking daily heart and blood pressure medications, seizure, reflux, allergy, asthma, anxiety, pain or diabetes medications, make sure you notify the surgery center/hospital before the day of surgery so they can tell you which medications you should take or avoid the day of surgery. 6. No food or drink after midnight the night before surgery unless directed otherwise by surgical center/hospital staff. 7. No alcoholic beverages 24-hours prior to surgery.  No smoking 24-hours prior or 24-hours after  surgery. 8. Wear loose pants or shorts. They should be loose enough to fit over bandages, boots, and casts. 9. Don't wear slip-on shoes. Sneakers are preferred. 10. Bring your boot with you to the surgery center/hospital.  Also bring crutches or a walker if your physician has prescribed it for you.  If you do not have this equipment, it will be provided for you after surgery. 11. If you have not been contacted by the surgery center/hospital by the day before your surgery, call to confirm the date and time of your surgery. 12. Leave-time from work may vary depending on the type of surgery you have.  Appropriate arrangements should be made prior to surgery with your employer. 13. Prescriptions will be provided immediately following surgery by your doctor.  Fill these as soon as possible after surgery and take the medication as directed. Pain medications will not be refilled on weekends and must be approved by the doctor. 14. Remove nail polish on the operative foot and avoid getting pedicures prior to surgery. 15. Wash the night before surgery.  The night before surgery wash the foot and leg well with water and the antibacterial soap provided. Be sure to pay special attention to beneath the toenails and in between the toes.  Wash for at least three (3) minutes. Rinse thoroughly with water and dry well with a towel.  Perform this wash unless told not to do so by your physician.  Enclosed: 1 Ice pack (please put in freezer the night before surgery)   1 Hibiclens skin cleaner     Pre-op instructions  If you have any questions regarding the instructions, please do not hesitate to call our office.  [place]: [address], [place], [place] [address] -- [phone]  [place]: [address] [place], [place] [address] -- [phone]  [place]: [address]  [place], [place] [address] -- [phone]   Website: [email or web address] 

## 2019-01-25 NOTE — Telephone Encounter (Signed)
Entered in error

## 2019-01-26 ENCOUNTER — Telehealth: Payer: Self-pay | Admitting: Podiatry

## 2019-01-26 NOTE — Telephone Encounter (Signed)
DOS: 02/18/2019  SURGICAL PROCEDURES: Excision Os Trigonum YOVZ(85885), Repair Peroneal Tendons OYDX(41287), Endoscopic Plantar Fasciotomy OMVE(72094), Injection Ankle Joint Under Flouroscopy (cortisone) BSJG(28366), and Possible Cast Application Left  BCBS Policy Effective : 29/47/6546  -  03/09/9998  Member Liability Summary       In-Network   Max Per Benefit Period Year-to-Date Remaining     CoInsurance         Deductible $2,750.00 $2,717.84     Out-Of-Pocket 3 $6,850.00 $6,262.07 3  Out-of-Pocket includes copay, deductible, and coinsurance.  H[facility] - [facility]      In-Network Copay Coinsurance Authorization Required Not Applicable 50%  per  Service Year No

## 2019-01-26 NOTE — Progress Notes (Signed)
She presented today for surgical consult regarding her left foot.  She states that the pain seems to not be so severe after wearing her cam walker for a while.  States that it is on and off sometimes it hurts so bad I need to have surgery other times does not hurt that bad at all.  She denies any change in her past medical history medications allergies surgeries and social history.  ROS: Denies fever chills nausea vomiting muscle aches pains calf pain back pain chest pain shortness of breath.  Objective: Vital signs are stable she is alert and oriented x3.  Pulses are palpable.  Neurologic sensorium is intact.  Deep tendon reflexes are intact.  Muscle strength is normal symmetrical.  She has some reproducible pain on palpation of the peroneal tendons today as well as the palpation of the posterior subtalar joint and ankle.  She also has pain on palpation of the medial calcaneal tubercle of the left heel with some tenderness along the posterior tibial tendon.  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  Also demonstrates peroneal tendinitis as well as plantar fasciitis with pes planus.  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  We discussed the pros and cons of surgery we discussed that she may be casted after surgery.  We discussed the possible postop complications which may include but not limited to postop pain bleeding swelling infection recurrence need for further surgery overcorrection under correction also digit loss of limb loss of life.  She understands this is amenable to it and will follow-up with me in the near future for surgical intervention.

## 2019-02-16 ENCOUNTER — Telehealth: Payer: Self-pay | Admitting: Podiatry

## 2019-02-16 NOTE — Telephone Encounter (Signed)
Caren Griffins at [facility] e-mailed me letting me know that pt is cancelling her surgery for this Friday, 12/11 due to her cost. They tried to make arrangements with her but the pt stated she would contact our office and reschedule her surgery. I have cancelled her surgery on Dr. Stephenie Acres schedule in both the surgery book and Epic and I've cancelled her postop appointments. I'm emailing Caren Griffins back to let her know I got the message.

## 2019-02-24 ENCOUNTER — Encounter: Payer: BC Managed Care – PPO | Admitting: Podiatry

## 2019-03-08 ENCOUNTER — Encounter: Payer: BC Managed Care – PPO | Admitting: Podiatry

## 2019-03-22 ENCOUNTER — Encounter: Payer: BC Managed Care – PPO | Admitting: Podiatry

## 2019-03-31 ENCOUNTER — Encounter: Payer: BC Managed Care – PPO | Admitting: Podiatry

## 2019-04-12 ENCOUNTER — Other Ambulatory Visit: Payer: Self-pay | Admitting: Family Medicine

## 2019-06-14 ENCOUNTER — Telehealth: Payer: Self-pay | Admitting: Podiatry

## 2019-06-14 NOTE — Telephone Encounter (Signed)
I reviewed the pt clinicals and noticed pt had not been evaluated in office since 01/25/2019 and she should be evaluated before wearing the cam boot for such a long-period of time. I left message for pt to call to schedule and to discuss the status of the foot pain.

## 2019-06-14 NOTE — Telephone Encounter (Addendum)
I informed pt I could write a note for her to wear the cam boot for another 3 months but she would need to make an appt to be reevaluated about 1 1/2 months into the time period to evaluate progress. Pt states understanding and I transferred to the scheduler.

## 2019-06-14 NOTE — Telephone Encounter (Signed)
Pt called needing a note stating she can wear her CAM boot while at work for 3 more months. Please advise

## 2019-06-15 NOTE — Telephone Encounter (Signed)
I informed pt I had reviewed her clinicals and felt she should be reevaluated prior to wearing the cam boot for such a long period of time, but could write a note for her to wear the cam boot at work until reevaluated in office at her appt. Pt states she has an appt 06/23/2019. I asked pt if she would like the note to be emailed. Pt's email is [email or web address].

## 2019-06-23 ENCOUNTER — Other Ambulatory Visit: Payer: Self-pay

## 2019-06-23 ENCOUNTER — Encounter: Payer: Self-pay | Admitting: Podiatry

## 2019-06-23 ENCOUNTER — Ambulatory Visit: Payer: BC Managed Care – PPO | Admitting: Podiatry

## 2019-06-23 DIAGNOSIS — M7672 Peroneal tendinitis, left leg: Secondary | ICD-10-CM | POA: Diagnosis not present

## 2019-06-23 DIAGNOSIS — M722 Plantar fascial fibromatosis: Secondary | ICD-10-CM | POA: Diagnosis not present

## 2019-06-23 DIAGNOSIS — Q688 Other specified congenital musculoskeletal deformities: Secondary | ICD-10-CM | POA: Diagnosis not present

## 2019-06-23 DIAGNOSIS — M7752 Other enthesopathy of left foot: Secondary | ICD-10-CM | POA: Diagnosis not present

## 2019-06-23 NOTE — Progress Notes (Signed)
She presents today for follow-up of her os trigonum syndrome left foot.  States that really started hurting about a month ago.  Objective: Vital signs are stable she is alert oriented x3.  Pulses are palpable.  She has pain on sharp plantar flexion in the posterior ankle subtalar joint area and particularly with dorsiflexion of the hallux.  Assessment: Os trigonum syndrome capsulitis.  Plan: We discussed once again surgical intervention today versus injection therapy she would like to continue with injection therapy at this time.  I went ahead and injected 10 mg Kenalog 5 mg Marcaine point maximal tenderness of her left posterior subtalar joint.  She tolerated procedure well without applications.

## 2019-06-24 ENCOUNTER — Telehealth: Payer: Self-pay | Admitting: Podiatry

## 2019-06-24 NOTE — Telephone Encounter (Signed)
Jenna Yoder called and would like a note for accommodation at work, to be able to rest and also to be able to wear her boot while at work. If accommodations are placed, what is estimation of length of time, she is able to rest and how long are accommodations in place, ex... 2-3 months. [employer]

## 2019-06-28 ENCOUNTER — Encounter: Payer: Self-pay | Admitting: Podiatry

## 2019-06-28 NOTE — Telephone Encounter (Signed)
You may write this for her.  3 months sounds good to start.

## 2019-06-29 ENCOUNTER — Telehealth: Payer: Self-pay | Admitting: Family Medicine

## 2019-06-29 NOTE — Telephone Encounter (Signed)
Error

## 2019-06-29 NOTE — Telephone Encounter (Signed)
Please place labs prior to appt. 

## 2019-06-29 NOTE — Telephone Encounter (Signed)
Please order labs for fasting labs for up coming  physical 07/15/2019 pt is on nurse schedule for 07/12/19

## 2019-07-05 ENCOUNTER — Other Ambulatory Visit: Payer: Self-pay

## 2019-07-05 DIAGNOSIS — E7800 Pure hypercholesterolemia, unspecified: Secondary | ICD-10-CM

## 2019-07-05 DIAGNOSIS — I10 Essential (primary) hypertension: Secondary | ICD-10-CM

## 2019-07-12 ENCOUNTER — Ambulatory Visit (INDEPENDENT_AMBULATORY_CARE_PROVIDER_SITE_OTHER): Payer: BC Managed Care – PPO | Admitting: Family Medicine

## 2019-07-12 ENCOUNTER — Other Ambulatory Visit: Payer: Self-pay

## 2019-07-12 DIAGNOSIS — E7800 Pure hypercholesterolemia, unspecified: Secondary | ICD-10-CM

## 2019-07-12 DIAGNOSIS — I10 Essential (primary) hypertension: Secondary | ICD-10-CM

## 2019-07-13 LAB — CMP14+EGFR
ALT: 7 IU/L (ref 0–32)
AST: 15 IU/L (ref 0–40)
Albumin/Globulin Ratio: 1.4 (ref 1.2–2.2)
Albumin: 4.2 g/dL (ref 3.8–4.8)
Alkaline Phosphatase: 77 IU/L (ref 39–117)
BUN/Creatinine Ratio: 13 (ref 9–23)
BUN: 9 mg/dL (ref 6–24)
Bilirubin Total: 0.4 mg/dL (ref 0.0–1.2)
CO2: 23 mmol/L (ref 20–29)
Calcium: 9.5 mg/dL (ref 8.7–10.2)
Chloride: 104 mmol/L (ref 96–106)
Creatinine, Ser: 0.67 mg/dL (ref 0.57–1.00)
GFR calc Af Amer: 126 mL/min/{1.73_m2} (ref >59)
GFR calc non Af Amer: 110 mL/min/{1.73_m2} (ref >59)
Globulin, Total: 3 g/dL (ref 1.5–4.5)
Glucose: 81 mg/dL (ref 65–99)
Potassium: 4 mmol/L (ref 3.5–5.2)
Sodium: 138 mmol/L (ref 134–144)
Total Protein: 7.2 g/dL (ref 6.0–8.5)

## 2019-07-13 LAB — LIPID PANEL
Chol/HDL Ratio: 2.5 ratio (ref 0.0–4.4)
Cholesterol, Total: 219 mg/dL — ABNORMAL HIGH (ref 100–199)
HDL: 86 mg/dL (ref >39)
LDL Chol Calc (NIH): 126 mg/dL — ABNORMAL HIGH (ref 0–99)
Triglycerides: 42 mg/dL (ref 0–149)
VLDL Cholesterol Cal: 7 mg/dL (ref 5–40)

## 2019-07-13 LAB — TSH: TSH: 0.727 u[IU]/mL (ref 0.450–4.500)

## 2019-07-15 ENCOUNTER — Encounter: Payer: Self-pay | Admitting: Family Medicine

## 2019-07-15 ENCOUNTER — Ambulatory Visit (INDEPENDENT_AMBULATORY_CARE_PROVIDER_SITE_OTHER): Payer: BC Managed Care – PPO | Admitting: Family Medicine

## 2019-07-15 ENCOUNTER — Other Ambulatory Visit: Payer: Self-pay

## 2019-07-15 VITALS — BP 137/84 | HR 78 | Temp 97.6°F | Ht 67.0 in | Wt 182.0 lb

## 2019-07-15 DIAGNOSIS — Z1231 Encounter for screening mammogram for malignant neoplasm of breast: Secondary | ICD-10-CM

## 2019-07-15 DIAGNOSIS — Z0000 Encounter for general adult medical examination without abnormal findings: Secondary | ICD-10-CM

## 2019-07-15 NOTE — Patient Instructions (Addendum)
   If you have lab work done today you will be contacted with your lab results within the next 2 weeks.  If you have not heard from us then please contact us. The fastest way to get your results is to register for My Chart.   IF you received an x-ray today, you will receive an invoice from [facility]. Please contact [facility] at [phone] with questions or concerns regarding your invoice.   IF you received labwork today, you will receive an invoice from [facility]. Please contact [facility] at [phone] with questions or concerns regarding your invoice.   Our billing staff will not be able to assist you with questions regarding bills from these companies.  You will be contacted with the lab results as soon as they are available. The fastest way to get your results is to activate your My Chart account. Instructions are located on the last page of this paperwork. If you have not heard from us regarding the results in 2 weeks, please contact this office.     Preventive Care 40-64 Years Old, Female Preventive care refers to visits with your health care provider and lifestyle choices that can promote health and wellness. This includes:  A yearly physical exam. This may also be called an annual well check.  Regular dental visits and eye exams.  Immunizations.  Screening for certain conditions.  Healthy lifestyle choices, such as eating a healthy diet, getting regular exercise, not using drugs or products that contain nicotine and tobacco, and limiting alcohol use. What can I expect for my preventive care visit? Physical exam Your health care provider will check your:  Height and weight. This may be used to calculate body mass index (BMI), which tells if you are at a healthy weight.  Heart rate and blood pressure.  Skin for abnormal spots. Counseling Your health care provider may ask you questions about your:  Alcohol, tobacco, and drug use.  Emotional  well-being.  Home and relationship well-being.  Sexual activity.  Eating habits.  Work and work environment.  Method of birth control.  Menstrual cycle.  Pregnancy history. What immunizations do I need?  Influenza (flu) vaccine  This is recommended every year. Tetanus, diphtheria, and pertussis (Tdap) vaccine  You may need a Td booster every 10 years. Varicella (chickenpox) vaccine  You may need this if you have not been vaccinated. Zoster (shingles) vaccine  You may need this after age 60. Measles, mumps, and rubella (MMR) vaccine  You may need at least one dose of MMR if you were born in 1957 or later. You may also need a second dose. Pneumococcal conjugate (PCV13) vaccine  You may need this if you have certain conditions and were not previously vaccinated. Pneumococcal polysaccharide (PPSV23) vaccine  You may need one or two doses if you smoke cigarettes or if you have certain conditions. Meningococcal conjugate (MenACWY) vaccine  You may need this if you have certain conditions. Hepatitis A vaccine  You may need this if you have certain conditions or if you travel or work in places where you may be exposed to hepatitis A. Hepatitis B vaccine  You may need this if you have certain conditions or if you travel or work in places where you may be exposed to hepatitis B. Haemophilus influenzae type b (Hib) vaccine  You may need this if you have certain conditions. Human papillomavirus (HPV) vaccine  If recommended by your health care provider, you may need three doses over 6 months.   You may receive vaccines as individual doses or as more than one vaccine together in one shot (combination vaccines). Talk with your health care provider about the risks and benefits of combination vaccines. What tests do I need? Blood tests  Lipid and cholesterol levels. These may be checked every 5 years, or more frequently if you are over 50 years old.  Hepatitis C  test.  Hepatitis B test. Screening  Lung cancer screening. You may have this screening every year starting at age 55 if you have a 30-pack-year history of smoking and currently smoke or have quit within the past 15 years.  Colorectal cancer screening. All adults should have this screening starting at age 50 and continuing until age 75. Your health care provider may recommend screening at age 45 if you are at increased risk. You will have tests every 1-10 years, depending on your results and the type of screening test.  Diabetes screening. This is done by checking your blood sugar (glucose) after you have not eaten for a while (fasting). You may have this done every 1-3 years.  Mammogram. This may be done every 1-2 years. Talk with your health care provider about when you should start having regular mammograms. This may depend on whether you have a family history of breast cancer.  BRCA-related cancer screening. This may be done if you have a family history of breast, ovarian, tubal, or peritoneal cancers.  Pelvic exam and Pap test. This may be done every 3 years starting at age 21. Starting at age 30, this may be done every 5 years if you have a Pap test in combination with an HPV test. Other tests  Sexually transmitted disease (STD) testing.  Bone density scan. This is done to screen for osteoporosis. You may have this scan if you are at high risk for osteoporosis. Follow these instructions at home: Eating and drinking  Eat a diet that includes fresh fruits and vegetables, whole grains, lean protein, and low-fat dairy.  Take vitamin and mineral supplements as recommended by your health care provider.  Do not drink alcohol if: ? Your health care provider tells you not to drink. ? You are pregnant, may be pregnant, or are planning to become pregnant.  If you drink alcohol: ? Limit how much you have to 0-1 drink a day. ? Be aware of how much alcohol is in your drink. In the [place] one  drink equals one 12 oz bottle of beer (355 mL), one 5 oz glass of wine (148 mL), or one 1 oz glass of hard liquor (44 mL). Lifestyle  Take daily care of your teeth and gums.  Stay active. Exercise for at least 30 minutes on 5 or more days each week.  Do not use any products that contain nicotine or tobacco, such as cigarettes, e-cigarettes, and chewing tobacco. If you need help quitting, ask your health care provider.  If you are sexually active, practice safe sex. Use a condom or other form of birth control (contraception) in order to prevent pregnancy and STIs (sexually transmitted infections).  If told by your health care provider, take low-dose aspirin daily starting at age 50. What's next?  Visit your health care provider once a year for a well check visit.  Ask your health care provider how often you should have your eyes and teeth checked.  Stay up to date on all vaccines. This information is not intended to replace advice given to you by your health care provider. Make sure   you discuss any questions you have with your health care provider. Document Revised: 11/05/2017 Document Reviewed: 11/05/2017 Elsevier Patient Education  2020 Elsevier Inc.  

## 2019-07-15 NOTE — Progress Notes (Signed)
5/7/20213:56 PM  Jenna Yoder Sep 19, 1975, 44 y.o., female [id number]  Chief Complaint  Patient presents with  . Annual Exam    no pap     HPI:   Patient is a 44 y.o. female with past medical history significant for GERD, HTN, HLP who presents today for CPE  Last CPE several years ago  Cervical Cancer Screening: 2019 at [place] [facility] Breast Cancer Screening: 2020 at [facility] Screening: at age 47 Bone Density Testing: at age 13 HIV Screening: declines STI Screening: declines Seasonal Influenza Vaccination: declines Td/Tdap Vaccination: due Pneumococcal Vaccination: at age 15 Zoster Vaccination: at age 25 covid vaccine: thinking about it Frequency of Dental evaluation: Q6 months Frequency of Eye evaluation: has not seen, has no concerns Has been working on her diet, focusing on lowering her cholesterol Goes to gym regularly   Hearing Screening   125Hz  250Hz  500Hz  1000Hz  2000Hz  3000Hz  4000Hz  6000Hz  8000Hz   Right ear:           Left ear:             Visual Acuity Screening   Right eye Left eye Both eyes  Without correction: 2020 2020 2020  With correction:       Depression screen [facility] 2/9 07/15/2019 12/09/2018  Decreased Interest 0 0  Down, Depressed, Hopeless 0 0  PHQ - 2 Score 0 0    Fall Risk  07/15/2019 12/09/2018  Falls in the past year? 0 0  Number falls in past yr: 0 0  Injury with Fall? 0 0  Follow up Falls evaluation completed -     No Known Allergies  Prior to Admission medications   Medication Sig Start Date End Date Taking? Authorizing Provider  omeprazole (PRILOSEC) 20 MG capsule TAKE ONE CAPSULE BY MOUTH TWICE A DAY BEFORE MEAL(S) Patient not taking: Reported on 07/15/2019 04/12/19   Rutherford Guys, MD    Past Medical History:  Diagnosis Date  . Dentalgia   . HTN (hypertension)   . UTI (lower urinary tract infection)     Past Surgical History:  Procedure Laterality Date  . CESAREAN SECTION    . TUBAL LIGATION      Social History   Tobacco Use  . Smoking status: Never Smoker  . Smokeless tobacco: Never Used  Substance Use Topics  . Alcohol use: Yes    Comment: occ    Family History  Problem Relation Age of Onset  . Diabetes Mother   . Hypertension Mother   . Diabetes Sister   . Hypertension Brother     Review of Systems  Constitutional: Negative for chills and fever.  HENT: Negative for hearing loss.   Eyes: Negative for blurred vision and double vision.  Respiratory: Negative for cough and shortness of breath.   Cardiovascular: Negative for chest pain, palpitations and leg swelling.  Gastrointestinal: Negative for abdominal pain, blood in stool, melena, nausea and vomiting.  Genitourinary: Negative for frequency and urgency.  Neurological: Negative for dizziness and headaches.  All other systems reviewed and are negative.    OBJECTIVE:  Today's Vitals   07/15/19 1546  BP: 137/84  Pulse: 78  Temp: 97.6 F (36.4 C)  SpO2: 98%  Weight: 182 lb (82.6 kg)  Height: 5\' 7"  (1.702 m)   Body mass index is 28.51 kg/m.   Physical Exam Vitals and nursing note reviewed. Exam conducted with a chaperone present.  Constitutional:      Appearance: She is well-developed.  HENT:  Head: Normocephalic and atraumatic.     Right Ear: Hearing, tympanic membrane, ear canal and external ear normal.     Left Ear: Hearing, tympanic membrane, ear canal and external ear normal.     Mouth/Throat:     Mouth: Mucous membranes are moist.     Pharynx: No oropharyngeal exudate or posterior oropharyngeal erythema.  Eyes:     Extraocular Movements: Extraocular movements intact.     Conjunctiva/sclera: Conjunctivae normal.     Pupils: Pupils are equal, round, and reactive to light.  Neck:     Thyroid: No thyromegaly.  Cardiovascular:     Rate and Rhythm: Normal rate and regular rhythm.     Heart sounds: Normal heart sounds. No murmur. No friction rub. No gallop.   Pulmonary:     Effort:  Pulmonary effort is normal.     Breath sounds: Normal breath sounds. No wheezing, rhonchi or rales.  Chest:     Breasts:        Right: No mass, nipple discharge or skin change.        Left: No mass, nipple discharge or skin change.  Abdominal:     General: Bowel sounds are normal. There is no distension.     Palpations: Abdomen is soft. There is no hepatomegaly, splenomegaly or mass.     Tenderness: There is no abdominal tenderness.  Musculoskeletal:        General: Normal range of motion.     Cervical back: Neck supple.     Right lower leg: No edema.     Left lower leg: No edema.  Lymphadenopathy:     Cervical: No cervical adenopathy.     Upper Body:     Right upper body: No supraclavicular, axillary or pectoral adenopathy.     Left upper body: No supraclavicular, axillary or pectoral adenopathy.  Skin:    General: Skin is warm and dry.  Neurological:     Mental Status: She is alert and oriented to person, place, and time.     Cranial Nerves: No cranial nerve deficit.     Gait: Gait normal.     Deep Tendon Reflexes: Reflexes are normal and symmetric.  Psychiatric:        Mood and Affect: Mood normal.        Behavior: Behavior normal.     No results found for this or any previous visit (from the past 24 hour(s)).  No results found.   ASSESSMENT and PLAN  1. Annual physical exam No concerns per history or exam. Routine HCM labs ordered. HCM reviewed/discussed. Anticipatory guidance regarding healthy weight, lifestyle and choices given.   2. Visit for screening mammogram - MM DIGITAL SCREENING BILATERAL; Future  Return in about 1 year (around 07/14/2020) for CPE with pap.    Myles Lipps, MD Primary Care at [facility] [address] [place], [place] [address] Ph.  [phone] Fax [phone]

## 2019-10-20 ENCOUNTER — Ambulatory Visit: Payer: BC Managed Care – PPO | Admitting: Podiatry

## 2019-11-29 ENCOUNTER — Encounter: Payer: Self-pay | Admitting: Podiatry

## 2019-11-29 ENCOUNTER — Ambulatory Visit: Payer: BC Managed Care – PPO | Admitting: Podiatry

## 2019-11-29 ENCOUNTER — Other Ambulatory Visit: Payer: Self-pay

## 2019-11-29 DIAGNOSIS — Q688 Other specified congenital musculoskeletal deformities: Secondary | ICD-10-CM

## 2019-11-29 DIAGNOSIS — M7752 Other enthesopathy of left foot: Secondary | ICD-10-CM | POA: Diagnosis not present

## 2019-11-29 MED ORDER — METHYLPREDNISOLONE 4 MG PO TBPK: ORAL_TABLET | ORAL | 0 refills | Status: DC

## 2019-12-01 NOTE — Progress Notes (Signed)
She presents today for follow-up of her pain to the posterior lateral aspect of her ankle left.  She denies fever chills nausea vomiting muscle aches pains calf pain back pain chest pain shortness of breath states that it is hurts her on sharp plantar flexion.  Objective: Vitals are stable she alert oriented x3 has pain on palpation of the posterior aspect of her subtalar joint and on sharp plantar flexion of the joint ankle.  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  Plan: Injected the area today with 20 mg Kenalog 5 mg Marcaine point maximal tenderness she relates that it feels much better as she is leaving the building.

## 2020-01-16 ENCOUNTER — Other Ambulatory Visit: Payer: Self-pay | Admitting: Lab

## 2020-01-16 DIAGNOSIS — Z1231 Encounter for screening mammogram for malignant neoplasm of breast: Secondary | ICD-10-CM

## 2020-01-17 DIAGNOSIS — Z1231 Encounter for screening mammogram for malignant neoplasm of breast: Secondary | ICD-10-CM

## 2020-01-24 ENCOUNTER — Encounter: Payer: Self-pay | Admitting: Family Medicine

## 2020-01-24 ENCOUNTER — Ambulatory Visit (INDEPENDENT_AMBULATORY_CARE_PROVIDER_SITE_OTHER): Payer: BC Managed Care – PPO | Admitting: Family Medicine

## 2020-01-24 ENCOUNTER — Other Ambulatory Visit: Payer: Self-pay

## 2020-01-24 VITALS — BP 134/74 | HR 62 | Temp 99.1°F | Ht 67.0 in | Wt 178.0 lb

## 2020-01-24 DIAGNOSIS — K219 Gastro-esophageal reflux disease without esophagitis: Secondary | ICD-10-CM | POA: Diagnosis not present

## 2020-01-24 DIAGNOSIS — Z1231 Encounter for screening mammogram for malignant neoplasm of breast: Secondary | ICD-10-CM

## 2020-01-24 MED ORDER — OMEPRAZOLE 20 MG PO CPDR: DELAYED_RELEASE_CAPSULE | ORAL | 3 refills | Status: DC

## 2020-01-24 NOTE — Progress Notes (Addendum)
11/16/20212:22 PM  Jenna Yoder [birth year], 44 y.o., female [id number]  Chief Complaint  Patient presents with  . mammogram    HPI:   Patient is a 44 y.o. female with past medical history significant for GERD, HTN, HLP who presents today for needing a mammogram.  Cervical Cancer Screening: 2019 at [facility] [place] [facility] Breast Cancer Screening: 2020 at [facility] Colorectal Cancer Screening: at age 78 Bone Density Testing: at age 27 HIV Screening: declines STI Screening: declines Seasonal Influenza Vaccination: declines Td/Tdap Vaccination: due Pneumococcal Vaccination: at age 21 Zoster Vaccination: at age 44 covid vaccine: thinking about it Frequency of Dental evaluation: Q6 months Frequency of Eye evaluation: has not seen, has no concerns Has been working on her diet, focusing on lowering her cholesterol Goes to gym regularly  GERD Needs refill for omeprazole. Had previously stopped medication, is having ongoing heartburn  Depression screen [facility] 2/9 07/15/2019 12/09/2018  Decreased Interest 0 0  Down, Depressed, Hopeless 0 0  PHQ - 2 Score 0 0    Fall Risk  07/15/2019 12/09/2018  Falls in the past year? 0 0  Number falls in past yr: 0 0  Injury with Fall? 0 0  Follow up Falls evaluation completed -     No Known Allergies  Prior to Admission medications   Medication Sig Start Date End Date Taking? Authorizing Provider  methylPREDNISolone (MEDROL DOSEPAK) 4 MG TBPK tablet 6 day dose pack - take as directed 11/29/19   Hyatt, Max T, DPM  omeprazole (PRILOSEC) 20 MG capsule TAKE ONE CAPSULE BY MOUTH TWICE A DAY BEFORE MEAL(S) Patient not taking: Reported on 07/15/2019 04/12/19   Myles Lipps, MD    Past Medical History:  Diagnosis Date  . Dentalgia   . HTN (hypertension)   . UTI (lower urinary tract infection)     Past Surgical History:  Procedure Laterality Date  . CESAREAN SECTION    . TUBAL LIGATION      Social History   Tobacco Use  . Smoking status:  Never Smoker  . Smokeless tobacco: Never Used  Substance Use Topics  . Alcohol use: Yes    Comment: occ    Family History  Problem Relation Age of Onset  . Diabetes Mother   . Hypertension Mother   . Cancer Father   . Diabetes Sister   . Hypertension Brother   . Cancer Brother     Review of Systems  Constitutional: Negative for chills, fever and malaise/fatigue.  Eyes: Negative for blurred vision and double vision.  Respiratory: Negative for cough, shortness of breath and wheezing.   Cardiovascular: Negative for chest pain, palpitations and leg swelling.  Gastrointestinal: Positive for heartburn. Negative for abdominal pain, constipation, diarrhea, nausea and vomiting.  Musculoskeletal: Negative for back pain and joint pain.  Skin: Negative for rash.  Neurological: Negative for dizziness, weakness and headaches.     OBJECTIVE:  Today's Vitals   01/24/20 1404  BP: 134/74  Pulse: 62  Temp: 99.1 F (37.3 C)  SpO2: 100%  Weight: 178 lb (80.7 kg)  Height: 5\' 7"  (1.702 m)   Body mass index is 27.88 kg/m.   Physical Exam Constitutional:      General: She is not in acute distress.    Appearance: Normal appearance. She is not ill-appearing.  HENT:     Head: Normocephalic.  Cardiovascular:     Rate and Rhythm: Normal rate and regular rhythm.     Pulses: Normal pulses.     Heart  sounds: Normal heart sounds. No murmur heard.  No friction rub. No gallop.   Pulmonary:     Effort: Pulmonary effort is normal. No respiratory distress.     Breath sounds: Normal breath sounds. No stridor. No wheezing, rhonchi or rales.  Abdominal:     General: Bowel sounds are normal.     Palpations: Abdomen is soft.     Tenderness: There is no abdominal tenderness.  Musculoskeletal:     Right lower leg: No edema.     Left lower leg: No edema.  Skin:    General: Skin is warm and dry.  Neurological:     Mental Status: She is alert and oriented to person, place, and time.   Psychiatric:        Mood and Affect: Mood normal.        Behavior: Behavior normal.     No results found for this or any previous visit (from the past 24 hour(s)).  No results found.   ASSESSMENT and PLAN  Problem List Items Addressed This Visit      Digestive   Gastroesophageal reflux disease   Relevant Medications   omeprazole (PRILOSEC) 20 MG capsule    Other Visit Diagnoses    Visit for screening mammogram    -  Primary   Relevant Orders   MM DIAG BREAST TOMO BILATERAL      Return for at next scheduled appointment.   Macario Carls Courtney Fenlon, FNP-BC Primary Care at [facility] [address] [place], [place] [address] Ph.  [phone] Fax [phone]  I have reviewed and agree with above documentation. Edwina Barth, MD

## 2020-01-24 NOTE — Patient Instructions (Addendum)
Placed order for mammogram  Sent the omeprazole to your pharmacy  Gastroesophageal Reflux Disease, Adult Gastroesophageal reflux (GER) happens when acid from the stomach flows up into the tube that connects the mouth and the stomach (esophagus). Normally, food travels down the esophagus and stays in the stomach to be digested. With GER, food and stomach acid sometimes move back up into the esophagus. You may have a disease called gastroesophageal reflux disease (GERD) if the reflux:  Happens often.  Causes frequent or very bad symptoms.  Causes problems such as damage to the esophagus. When this happens, the esophagus becomes sore and swollen (inflamed). Over time, GERD can make small holes (ulcers) in the lining of the esophagus. What are the causes? This condition is caused by a problem with the muscle between the esophagus and the stomach. When this muscle is weak or not normal, it does not close properly to keep food and acid from coming back up from the stomach. The muscle can be weak because of:  Tobacco use.  Pregnancy.  Having a certain type of hernia (hiatal hernia).  Alcohol use.  Certain foods and drinks, such as coffee, chocolate, onions, and peppermint. What increases the risk? You are more likely to develop this condition if you:  Are overweight.  Have a disease that affects your connective tissue.  Use NSAID medicines. What are the signs or symptoms? Symptoms of this condition include:  Heartburn.  Difficult or painful swallowing.  The feeling of having a lump in the throat.  A bitter taste in the mouth.  Bad breath.  Having a lot of saliva.  Having an upset or bloated stomach.  Belching.  Chest pain. Different conditions can cause chest pain. Make sure you see your doctor if you have chest pain.  Shortness of breath or noisy breathing (wheezing).  Ongoing (chronic) cough or a cough at night.  Wearing away of the surface of teeth (tooth  enamel).  Weight loss. How is this treated? Treatment will depend on how bad your symptoms are. Your doctor may suggest:  Changes to your diet.  Medicine.  Surgery. Follow these instructions at home: Eating and drinking   Follow a diet as told by your doctor. You may need to avoid foods and drinks such as: ? Coffee and tea (with or without caffeine). ? Drinks that contain alcohol. ? Energy drinks and sports drinks. ? Bubbly (carbonated) drinks or sodas. ? Chocolate and cocoa. ? Peppermint and mint flavorings. ? Garlic and onions. ? Horseradish. ? Spicy and acidic foods. These include peppers, chili powder, curry powder, vinegar, hot sauces, and BBQ sauce. ? Citrus fruit juices and citrus fruits, such as oranges, lemons, and limes. ? Tomato-based foods. These include red sauce, chili, salsa, and pizza with red sauce. ? Fried and fatty foods. These include donuts, french fries, potato chips, and high-fat dressings. ? High-fat meats. These include hot dogs, rib eye steak, sausage, ham, and bacon. ? High-fat dairy items, such as whole milk, butter, and cream cheese.  Eat small meals often. Avoid eating large meals.  Avoid drinking large amounts of liquid with your meals.  Avoid eating meals during the 2-3 hours before bedtime.  Avoid lying down right after you eat.  Do not exercise right after you eat. Lifestyle   Do not use any products that contain nicotine or tobacco. These include cigarettes, e-cigarettes, and chewing tobacco. If you need help quitting, ask your doctor.  Try to lower your stress. If you need help doing  this, ask your doctor.  If you are overweight, lose an amount of weight that is healthy for you. Ask your doctor about a safe weight loss goal. General instructions  Pay attention to any changes in your symptoms.  Take over-the-counter and prescription medicines only as told by your doctor. Do not take aspirin, ibuprofen, or other NSAIDs unless your  doctor says it is okay.  Wear loose clothes. Do not wear anything tight around your waist.  Raise (elevate) the head of your bed about 6 inches (15 cm).  Avoid bending over if this makes your symptoms worse.  Keep all follow-up visits as told by your doctor. This is important. Contact a doctor if:  You have new symptoms.  You lose weight and you do not know why.  You have trouble swallowing or it hurts to swallow.  You have wheezing or a cough that keeps happening.  Your symptoms do not get better with treatment.  You have a hoarse voice. Get help right away if:  You have pain in your arms, neck, jaw, teeth, or back.  You feel sweaty, dizzy, or light-headed.  You have chest pain or shortness of breath.  You throw up (vomit) and your throw-up looks like blood or coffee grounds.  You pass out (faint).  Your poop (stool) is bloody or black.  You cannot swallow, drink, or eat. Summary  If a person has gastroesophageal reflux disease (GERD), food and stomach acid move back up into the esophagus and cause symptoms or problems such as damage to the esophagus.  Treatment will depend on how bad your symptoms are.  Follow a diet as told by your doctor.  Take all medicines only as told by your doctor. This information is not intended to replace advice given to you by your health care provider. Make sure you discuss any questions you have with your health care provider. Document Revised: 09/02/2017 Document Reviewed: 09/02/2017 Elsevier Patient Education  [employer].   If you have lab work done today you will be contacted with your lab results within the next 2 weeks.  If you have not heard from [place] then please contact us. The fastest way to get your results is to register for My Chart.   IF you received an x-ray today, you will receive an invoice from [facility]. Please contact [facility] at [phone] with questions or concerns regarding your  invoice.   IF you received labwork today, you will receive an invoice from [place]. Please contact [facility] at [phone] with questions or concerns regarding your invoice.   Our billing staff will not be able to assist you with questions regarding bills from these companies.  You will be contacted with the lab results as soon as they are available. The fastest way to get your results is to activate your My Chart account. Instructions are located on the last page of this paperwork. If you have not heard from [place] regarding the results in 2 weeks, please contact this office.

## 2020-02-14 ENCOUNTER — Ambulatory Visit: Payer: BC Managed Care – PPO | Admitting: Podiatry

## 2020-02-14 ENCOUNTER — Encounter: Payer: Self-pay | Admitting: Podiatry

## 2020-02-14 ENCOUNTER — Other Ambulatory Visit: Payer: Self-pay

## 2020-02-14 DIAGNOSIS — M7752 Other enthesopathy of left foot: Secondary | ICD-10-CM | POA: Diagnosis not present

## 2020-02-14 DIAGNOSIS — Q688 Other specified congenital musculoskeletal deformities: Secondary | ICD-10-CM

## 2020-02-14 MED ORDER — TRIAMCINOLONE ACETONIDE 40 MG/ML IJ SUSP
20.0000 mg | Freq: Once | INTRAMUSCULAR | Status: AC
  Administered 2020-02-14: 20 mg

## 2020-02-14 NOTE — Progress Notes (Signed)
She presents today for follow-up of her tendinitis and capsulitis with a os trigonum syndrome left. States that she has noticed that is starting to become more painful with a more walking she does with her new job at [employer]. She is currently in management now.  Objective: Vital signs are stable alert oriented x3. She has pain on palpation deep posterior aspect of her left ankle to the posterior subtalar joint area.  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  Plan: Discussed etiology pathology and surgical therapies injected 10 mg Kenalog 5 mg Marcaine to the posterior lateral aspect of the subtalar joint left. She tolerated procedure well and I will follow-up with her on an as-needed basis.

## 2020-02-17 ENCOUNTER — Other Ambulatory Visit: Payer: Self-pay | Admitting: Family Medicine

## 2020-02-17 DIAGNOSIS — Z1231 Encounter for screening mammogram for malignant neoplasm of breast: Secondary | ICD-10-CM

## 2020-04-02 ENCOUNTER — Ambulatory Visit: Payer: BC Managed Care – PPO

## 2020-05-08 ENCOUNTER — Other Ambulatory Visit: Payer: Self-pay

## 2020-05-08 ENCOUNTER — Ambulatory Visit (INDEPENDENT_AMBULATORY_CARE_PROVIDER_SITE_OTHER): Payer: BC Managed Care – PPO | Admitting: Podiatry

## 2020-05-08 ENCOUNTER — Encounter: Payer: Self-pay | Admitting: Podiatry

## 2020-05-08 DIAGNOSIS — Q688 Other specified congenital musculoskeletal deformities: Secondary | ICD-10-CM

## 2020-05-08 DIAGNOSIS — M7752 Other enthesopathy of left foot: Secondary | ICD-10-CM

## 2020-05-08 MED ORDER — CELECOXIB 100 MG PO CAPS
100.0000 mg | ORAL_CAPSULE | Freq: Two times a day (BID) | ORAL | 2 refills | Status: DC
Start: 2020-05-08 — End: 2020-06-29

## 2020-05-08 NOTE — Progress Notes (Signed)
She presents today for follow-up of her subtalar joint capsulitis left she is also following up for her posterior aspect of her ankle and subtalar joint at the accessory ossicle.  She states that it is tender but is not so bad that I need a shot.  Objective: Vital signs are stable alert oriented x3 there is no erythema edema cellulitis drainage odor is mild tenderness on sharp plantar flexion of the foot as well as dorsiflexion of the hallux.  The majority the pain is in the posterior subtalar joint area superior calcaneus proximally.  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  Plan: Discussed etiology pathology and surgical therapies at this point I wrote a prescription for Celebrex 100 mg 1 p.o. twice daily and I will follow-up with her whenever she would like to have an injection.  Her birthday is in [birth year] and she is going to [facility] for that so she states that she will probably see me before then.

## 2020-05-16 ENCOUNTER — Encounter: Payer: Self-pay | Admitting: Family Medicine

## 2020-05-16 ENCOUNTER — Ambulatory Visit (INDEPENDENT_AMBULATORY_CARE_PROVIDER_SITE_OTHER): Payer: BC Managed Care – PPO | Admitting: Family Medicine

## 2020-05-16 ENCOUNTER — Other Ambulatory Visit: Payer: Self-pay

## 2020-05-16 VITALS — BP 125/84 | HR 84 | Temp 97.6°F | Ht 67.0 in | Wt 185.0 lb

## 2020-05-16 DIAGNOSIS — K219 Gastro-esophageal reflux disease without esophagitis: Secondary | ICD-10-CM | POA: Diagnosis not present

## 2020-05-16 DIAGNOSIS — L308 Other specified dermatitis: Secondary | ICD-10-CM | POA: Diagnosis not present

## 2020-05-16 MED ORDER — FLUTICASONE PROPIONATE 0.05 % EX LOTN: 1.0000 "application " | TOPICAL_LOTION | Freq: Every day | CUTANEOUS | 1 refills | Status: DC

## 2020-05-16 MED ORDER — OMEPRAZOLE 20 MG PO CPDR
DELAYED_RELEASE_CAPSULE | ORAL | 3 refills | Status: DC
Start: 2020-05-16 — End: 2020-11-16

## 2020-05-16 NOTE — Patient Instructions (Addendum)
If you have lab work done today you will be contacted with your lab results within the next 2 weeks.  If you have not heard from [place] then please contact us. The fastest way to get your results is to register for My Chart.   IF you received an x-ray today, you will receive an invoice from [facility]. Please contact [facility] at [phone] with questions or concerns regarding your invoice.   IF you received labwork today, you will receive an invoice from [place]. Please contact [facility] at [phone] with questions or concerns regarding your invoice.   Our billing staff will not be able to assist you with questions regarding bills from these companies.  You will be contacted with the lab results as soon as they are available. The fastest way to get your results is to activate your My Chart account. Instructions are located on the last page of this paperwork. If you have not heard from [place] regarding the results in 2 weeks, please contact this office.    Eczema Eczema refers to a group of skin conditions that cause skin to become rough and inflamed. Each type of eczema has different triggers, symptoms, and treatments. Eczema of any type is usually itchy. Symptoms range from mild to severe. Eczema is not spread from person to person (is not contagious). It can appear on different parts of the body at different times. One person's eczema may look different from another person's eczema. What are the causes? The exact cause of this condition is not known. However, exposure to certain environmental factors, irritants, and allergens can make the condition worse. What are the signs or symptoms? Symptoms of this condition depend on the type of eczema you have. The types include:  Contact dermatitis. There are two kinds: ? Irritant contact dermatitis. This happens when something irritates the skin and causes a rash. ? Allergic contact dermatitis. This happens when your skin  comes in contact with something you are allergic to (allergens). This can include poison ivy, chemicals, or medicines that were applied to your skin.  Atopic dermatitis. This is a long-term (chronic) skin disease that keeps coming back (recurring). It is the most common type of eczema. Usual symptoms are a red rash and itchy, dry, scaly skin. It usually starts showing signs in infancy and can last through adulthood.  Dyshidrotic eczema. This is a form of eczema on the hands and feet. It shows up as very itchy, fluid-filled blisters. It can affect people of any age but is more common before age 49.  Hand eczema. This causes very itchy areas of skin on the palms and sides of the hands and fingers. This type of eczema is common in industrial jobs where you may be exposed to different types of irritants.  Lichen simplex chronicus. This type of eczema occurs when a person constantly scratches one area of the body. Repeated scratching of the area leads to thickened skin (lichenification). This condition can accompany other types of eczema. It is more common in adults but may also be seen in children.  Nummular eczema. This is a common type of eczema that most often affects the lower legs and the backs of the hands. It typically causes an itchy, red, circular, crusty lesion (plaque). Scratching may become a habit and can cause bleeding. Nummular eczema occurs most often in middle-aged or older people.  Seborrheic dermatitis. This is a common skin disease that mainly affects the scalp. It may also affect other  oily areas of the body, such as the face, sides of the nose, eyebrows, ears, eyelids, and chest. It is marked by small scaling and redness of the skin (erythema). This can affect people of all ages. In infants, this condition is called cradle cap.  Stasis dermatitis. This is a common skin disease that can cause itching, scaling, and hyperpigmentation, usually on the legs and feet. It occurs most often in  people who have a condition that prevents blood from being pumped through the veins in the legs (chronic venous insufficiency). Stasis dermatitis is a chronic condition that needs long-term management.   How is this diagnosed? This condition may be diagnosed based on:  A physical exam of your skin.  Your medical history.  Skin patch tests. These tests involve using patches that contain possible allergens and placing them on your back. Your health care provider will check in a few days to see if an allergic reaction occurred. How is this treated? Treatment for eczema is based on the type of eczema you have. You may be given hydrocortisone steroid medicine or antihistamines. These can relieve itching quickly and help reduce inflammation. These may be prescribed or purchased over the counter, depending on the strength that is needed. Follow these instructions at home:  Take or apply over-the-counter and prescription medicines only as told by your health care provider.  Use creams or ointments to moisturize your skin. Do not use lotions.  Learn what triggers or irritates your symptoms so you can avoid these things.  Treat symptom flare-ups quickly.  Do not scratch your skin. This can make your rash worse.  Keep all follow-up visits. This is important. Where to find more information  American Academy of Dermatology: MarketingSheets.si  National Eczema Association: nationaleczema.org  The Society for Pediatric Dermatology: pedsderm.net Contact a health care provider if:  You have severe itching, even with treatment.  You scratch your skin regularly until it bleeds.  Your rash looks different than usual.  Your skin is painful, swollen, or more red than usual.  You have a fever. Summary  Eczema refers to a group of skin conditions that cause skin to become rough and inflamed. Each type has different triggers.  Eczema of any type causes itching that may range from mild to  severe.  Treatment varies based on the type of eczema you have. Hydrocortisone steroid medicine or antihistamines can help with itching and inflammation.  Protecting your skin is the best way to prevent eczema. Use creams or ointments to moisturize your skin. Avoid triggers and irritants. Treat flare-ups quickly. This information is not intended to replace advice given to you by your health care provider. Make sure you discuss any questions you have with your health care provider. Document Revised: 12/05/2019 Document Reviewed: 12/05/2019 Elsevier Patient Education  2021 [employer].

## 2020-05-16 NOTE — Progress Notes (Signed)
3/9/20229:15 AM  MIRAGE PFEFFERKORN [birth year], 45 y.o., female [id number]  Chief Complaint  Patient presents with  . Eczema    Uses cortizone otc not working on neck area    HPI:   Patient is a 45 y.o. female with past medical history significant for GERD, HTN, HLP who presents today for eczema.  Eczema Ocasionally gets some on her neck Has been using OTC hydrocortisone Does not use emollients of any nature Has tried rose water Normally only happens on her neck  Gerd Well controlled on Omeprazole 20 mg Takes this daily    Cervical Cancer Screening: [facility] [place] [facility] Breast Cancer Screening: scheduled for tomorrow at [facility] HIV Screening: declines STI Screening: declines Seasonal Influenza Vaccination: declines Td/Tdap Vaccination: due, declines Pneumococcal Vaccination: at age 42 Zoster Vaccination: at age 58 covid vaccine: thinking about it Frequency of Dental evaluation: Q6 months Frequency of Eye evaluation: has not seen, has no concerns Has been working on her diet, focusing on lowering her cholesterol Goes to gym regularly   Health Maintenance  Topic Date Due  . Hepatitis C Screening  Never done  . HIV Screening  Never done  . MAMMOGRAM  Never done  . PAP SMEAR-Modifier  Never done  . INFLUENZA VACCINE  06/07/2020 (Originally 10/09/2019)  . TETANUS/TDAP  01/23/2021 (Originally 07/24/1996)  . COVID-19 Vaccine (3 - Booster for Pfizer series) 10/07/2020  . HPV VACCINES  Aged Out     Depression screen [facility] 2/9 05/16/2020 07/15/2019 12/09/2018  Decreased Interest 0 0 0  Down, Depressed, Hopeless 0 0 0  PHQ - 2 Score 0 0 0    Fall Risk  05/16/2020 07/15/2019 12/09/2018  Falls in the past year? 0 0 0  Number falls in past yr: 0 0 0  Injury with Fall? 0 0 0  Follow up Falls evaluation completed Falls evaluation completed -     No Known Allergies  Prior to Admission medications   Medication Sig Start Date End Date Taking? Authorizing Provider   methylPREDNISolone (MEDROL DOSEPAK) 4 MG TBPK tablet 6 day dose pack - take as directed 11/29/19   Hyatt, Max T, DPM  omeprazole (PRILOSEC) 20 MG capsule TAKE ONE CAPSULE BY MOUTH TWICE A DAY BEFORE MEAL(S) Patient not taking: Reported on 07/15/2019 04/12/19   Myles Lipps, MD    Past Medical History:  Diagnosis Date  . Dentalgia   . HTN (hypertension)   . UTI (lower urinary tract infection)     Past Surgical History:  Procedure Laterality Date  . CESAREAN SECTION    . TUBAL LIGATION      Social History   Tobacco Use  . Smoking status: Never Smoker  . Smokeless tobacco: Never Used  Substance Use Topics  . Alcohol use: Yes    Comment: occ    Family History  Problem Relation Age of Onset  . Diabetes Mother   . Hypertension Mother   . Cancer Father   . Diabetes Sister   . Hypertension Brother   . Cancer Brother     Review of Systems  Constitutional: Negative for chills, fever and malaise/fatigue.  Eyes: Negative for blurred vision and double vision.  Respiratory: Negative for cough, shortness of breath and wheezing.   Cardiovascular: Negative for chest pain, palpitations and leg swelling.  Gastrointestinal: Negative for abdominal pain, constipation, diarrhea, heartburn, nausea and vomiting.  Musculoskeletal: Negative for back pain and joint pain.  Skin: Positive for rash (scaling to neck).  Neurological: Negative  for dizziness, weakness and headaches.     OBJECTIVE:  Today's Vitals   05/16/20 0859  BP: 125/84  Pulse: 84  Temp: 97.6 F (36.4 C)  SpO2: 96%  Weight: 185 lb (83.9 kg)  Height: 5\' 7"  (1.702 m)   Body mass index is 28.98 kg/m.   Physical Exam Constitutional:      General: She is not in acute distress.    Appearance: Normal appearance. She is not ill-appearing.  HENT:     Head: Normocephalic.  Cardiovascular:     Rate and Rhythm: Normal rate and regular rhythm.     Pulses: Normal pulses.     Heart sounds: Normal heart sounds. No murmur  heard. No friction rub. No gallop.   Pulmonary:     Effort: Pulmonary effort is normal. No respiratory distress.     Breath sounds: Normal breath sounds. No stridor. No wheezing, rhonchi or rales.  Abdominal:     General: Bowel sounds are normal.     Palpations: Abdomen is soft.     Tenderness: There is no abdominal tenderness.  Musculoskeletal:     Right lower leg: No edema.     Left lower leg: No edema.  Skin:    General: Skin is warm and dry.     Findings: Rash present. Rash is scaling (neck).  Neurological:     Mental Status: She is alert and oriented to person, place, and time.  Psychiatric:        Mood and Affect: Mood normal.        Behavior: Behavior normal.     No results found for this or any previous visit (from the past 24 hour(s)).  No results found.   ASSESSMENT and PLAN  Problem List Items Addressed This Visit      Digestive   Gastroesophageal reflux disease   Relevant Medications   omeprazole (PRILOSEC) 20 MG capsule    Other Visit Diagnoses    Other eczema    -  Primary   Relevant Medications   Fluticasone Propionate 0.05 % LOTN      Return if symptoms worsen or fail to improve.   Malyna Budney, FNP-BC Primary Care at [facility] [address] [place], [place] Ph.  [phone] Fax [phone]

## 2020-05-17 ENCOUNTER — Inpatient Hospital Stay: Admission: RE | Admit: 2020-05-17 | Payer: BC Managed Care – PPO | Source: Ambulatory Visit

## 2020-06-29 ENCOUNTER — Ambulatory Visit: Payer: BC Managed Care – PPO | Admitting: Family Medicine

## 2020-06-29 ENCOUNTER — Encounter: Payer: Self-pay | Admitting: Family Medicine

## 2020-06-29 ENCOUNTER — Other Ambulatory Visit: Payer: Self-pay

## 2020-06-29 VITALS — BP 112/80 | HR 58 | Ht 67.0 in | Wt 183.8 lb

## 2020-06-29 DIAGNOSIS — Z114 Encounter for screening for human immunodeficiency virus [HIV]: Secondary | ICD-10-CM

## 2020-06-29 DIAGNOSIS — Z800 Family history of malignant neoplasm of digestive organs: Secondary | ICD-10-CM | POA: Diagnosis not present

## 2020-06-29 DIAGNOSIS — Z1159 Encounter for screening for other viral diseases: Secondary | ICD-10-CM | POA: Diagnosis not present

## 2020-06-29 DIAGNOSIS — Z7689 Persons encountering health services in other specified circumstances: Secondary | ICD-10-CM | POA: Diagnosis not present

## 2020-06-29 DIAGNOSIS — L989 Disorder of the skin and subcutaneous tissue, unspecified: Secondary | ICD-10-CM

## 2020-06-29 DIAGNOSIS — I10 Essential (primary) hypertension: Secondary | ICD-10-CM

## 2020-06-29 DIAGNOSIS — K219 Gastro-esophageal reflux disease without esophagitis: Secondary | ICD-10-CM

## 2020-06-29 NOTE — Patient Instructions (Signed)
It was wonderful to see you today.  Keep up the great work with staying active and following a balanced diet.  We will be checking HIV and hepatitis C for screening purposes only, I will send you a MyChart message with these results.   Keep an eye on the area on your chest.  If this gets any larger, becomes painful, or notice more spots similar around your body please return to care.

## 2020-06-29 NOTE — Progress Notes (Signed)
SUBJECTIVE:   CHIEF COMPLAINT / HPI: New patient  Ms. Jenna Yoder is a 45 year old female presenting to establish care and discuss the following:  She reports she otherwise feels well.  Establishing care as her previous primary care closed recently.  All of her children go to [facility].  Skin bump: Noticed a small bump on her left upper chest a few months ago.  Has not changed in size since onset.  Hard to palpation.  It is not itchy or painful, she would just like to know how she should do about it.  Past medical history: --Ezcema: Uses steroid cream intermittently with daily emollient. --GERD: Protonix daily, well controlled. --Hyperlipidemia: Not on any medication.  Last checked in 07/2019 with LDL 126. - Previous history of hypertension: Elevated blood pressure several years ago, has not been on medication since that time.  Over the past few years she has lost approximately 50 pounds and now stays physically active with well-balanced diet.  Family history:  Reports a family history of colon cancer, she is unsure if it was her mom or father.  She also states that her brother had colon cancer and passed away at age 71.  Mom passed away around age 92 due to MI.  Father passed away in his 4s due to CVA.  Patient has not had an evaluation for colon cancer, previously told to start age 73.  She denies any melena, hematochezia, unexpected weight change, or difficulty with bowel movements.  Social history:  Lives with her husband and 3 children (22, 52, 15). She has one dog. 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 Goes to the gym and walks for work. 2 glasses of wine every other day. Non-smoker. No ilicit drugs.   Health [facility]  Upcoming mammogram in 07/2020.  Due for HIV/hepatitis C screening.   OBJECTIVE:   BP 112/80   Pulse (!) 58   Ht 5\' 7"  (1.702 m)   Wt 183 lb 12.8 oz (83.4 kg)   SpO2 100%   BMI 28.79 kg/m   General:  Alert, NAD HEENT: NCAT, MMM Cardiac: RRR no m/g/r Lungs: Clear bilaterally, no increased WOB  Abdomen: soft Msk: Moves all extremities spontaneously, normal gait   Ext: Warm, dry, 2+ distal pulses, no edema b/l Psych: Normal mood and affect.  Good eye contact Derm: Hard nodule approximate 1/2 cm in size present on left upper chest midclavicular.  Nontender to palpation.  Slight hyperpigmentation over without any erythema or drainage.  ASSESSMENT/PLAN:   Encounter to establish care with new doctor Discussed our residency running clinic and set expectations that she would have a new primary care provider in the next several months through our clinic.  Reviewed past medical, surgical, and social history.  Medications updated in epic.  Health maintenance reviewed and will obtain HIV/hepatitis C screening. Encouraged working towards a well-balanced diet and staying physically active as she already is.  Will obtain Pap smear results from previous OB/GYN office, she would like to through when due.   FHx: colon cancer Half brother passed away from colon cancer at age 36, also reports her mother or father also had colon cancer but she is unsure what age.  Discussed recommendation for colonoscopy now/age 57 due to history as above.  She currently has no concerning s/sx.   Gastroesophageal reflux disease Continue PPI as needed.  Essential hypertension BP 112/80 today, normal without medications.  Through chart review, her  BP has been WNL since 2017 off of medication after substantial weight loss and lifestyle changes.  Will transition hypertension to her past medical history in maintains after next visit.   Skin lesion Possible small sebaceous cyst vs calcium deposit.  Overall appears benign.  Recommending close observation, follow-up if lesion enlarging in size, painful/bothersome, or any additional concern.    Follow-up pending last Pap smear, otherwise follow-up in the next 6 months - 1 year  for regular check-in.  Jenna Stack, DO [place] [facility]

## 2020-06-30 ENCOUNTER — Encounter: Payer: Self-pay | Admitting: Family Medicine

## 2020-06-30 DIAGNOSIS — L989 Disorder of the skin and subcutaneous tissue, unspecified: Secondary | ICD-10-CM | POA: Insufficient documentation

## 2020-06-30 DIAGNOSIS — Z7689 Persons encountering health services in other specified circumstances: Secondary | ICD-10-CM | POA: Insufficient documentation

## 2020-06-30 LAB — HEPATITIS C ANTIBODY: Hep C Virus Ab: <0.1 s/co ratio (ref 0.0–0.9)

## 2020-06-30 LAB — HIV ANTIBODY (ROUTINE TESTING W REFLEX): HIV Screen 4th Generation wRfx: NONREACTIVE

## 2020-06-30 NOTE — Assessment & Plan Note (Signed)
Discussed our residency running clinic and set expectations that she would have a new primary care provider in the next several months through our clinic.  Reviewed past medical, surgical, and social history.  Medications updated in epic.  Health maintenance reviewed and will obtain HIV/hepatitis C screening. Encouraged working towards a well-balanced diet and staying physically active as she already is.  Will obtain Pap smear results from previous OB/GYN office, she would like to through [place] when due.

## 2020-06-30 NOTE — Assessment & Plan Note (Signed)
Half brother passed away from colon cancer at age 43, also reports her mother or father also had colon cancer but she is unsure what age.  Discussed recommendation for colonoscopy now/age 55 due to history as above.  She currently has no concerning s/sx.

## 2020-06-30 NOTE — Assessment & Plan Note (Signed)
Continue PPI as needed. 

## 2020-06-30 NOTE — Assessment & Plan Note (Signed)
Possible small sebaceous cyst vs calcium deposit.  Overall appears benign.  Recommending close observation, follow-up if lesion enlarging in size, painful/bothersome, or any additional concern.

## 2020-06-30 NOTE — Assessment & Plan Note (Signed)
BP 112/80 today, normal without medications.  Through chart review, her BP has been WNL since 2017 off of medication after substantial weight loss and lifestyle changes.  Will transition hypertension to her past medical history in maintains after next visit.

## 2020-07-02 ENCOUNTER — Other Ambulatory Visit: Payer: Self-pay | Admitting: Family Medicine

## 2020-07-02 DIAGNOSIS — Z800 Family history of malignant neoplasm of digestive organs: Secondary | ICD-10-CM

## 2020-07-03 ENCOUNTER — Other Ambulatory Visit: Payer: Self-pay | Admitting: Family Medicine

## 2020-07-03 DIAGNOSIS — E785 Hyperlipidemia, unspecified: Secondary | ICD-10-CM

## 2020-07-05 ENCOUNTER — Other Ambulatory Visit: Payer: Self-pay

## 2020-07-05 ENCOUNTER — Ambulatory Visit: Payer: BC Managed Care – PPO | Admitting: Podiatry

## 2020-07-05 DIAGNOSIS — Q688 Other specified congenital musculoskeletal deformities: Secondary | ICD-10-CM

## 2020-07-05 DIAGNOSIS — M7752 Other enthesopathy of left foot: Secondary | ICD-10-CM

## 2020-07-05 MED ORDER — TRIAMCINOLONE ACETONIDE 40 MG/ML IJ SUSP
20.0000 mg | Freq: Once | INTRAMUSCULAR | Status: AC
  Administered 2020-07-05: 20 mg

## 2020-07-05 NOTE — Progress Notes (Signed)
She presents today for follow-up of her os trigonum and capsulitis left she is interested in an injection.  States that is been bothering her for quite some time now and she has been been putting the injection off.  States that the Celebrex helps some.  Objective: Vital signs are stable she is alert and oriented x3.  Pulses are palpable.  She has pain on plantarflexion of the foot posteriorly.  Assessment os trigonum syndrome capsulitis.  Plan: Injected the area deep 20 mg Kenalog 5 mg Marcaine point maximal tenderness.  Discussed once again surgical intervention explained to her that this was going to be just another temporary fix until surgery is done she understands and is amenable to it follow-up with me as needed remember to ask how her trip to [place] went for her birthday.

## 2020-07-09 ENCOUNTER — Other Ambulatory Visit: Payer: Self-pay

## 2020-07-09 ENCOUNTER — Ambulatory Visit
Admission: RE | Admit: 2020-07-09 | Discharge: 2020-07-09 | Disposition: A | Discharge status: Home / Self Care | Payer: BC Managed Care – PPO | Source: Ambulatory Visit | Attending: Family Medicine | Admitting: Family Medicine

## 2020-07-09 DIAGNOSIS — Z1231 Encounter for screening mammogram for malignant neoplasm of breast: Secondary | ICD-10-CM

## 2020-07-11 ENCOUNTER — Other Ambulatory Visit: Payer: Self-pay | Admitting: Family Medicine

## 2020-07-11 DIAGNOSIS — R928 Other abnormal and inconclusive findings on diagnostic imaging of breast: Secondary | ICD-10-CM

## 2020-07-30 ENCOUNTER — Encounter: Payer: BC Managed Care – PPO | Admitting: Family Medicine

## 2020-08-01 ENCOUNTER — Encounter: Payer: Self-pay | Admitting: Gastroenterology

## 2020-08-02 ENCOUNTER — Inpatient Hospital Stay: Admission: RE | Admit: 2020-08-02 | Payer: BC Managed Care – PPO | Source: Ambulatory Visit

## 2020-08-23 ENCOUNTER — Ambulatory Visit
Admission: RE | Admit: 2020-08-23 | Discharge: 2020-08-23 | Disposition: A | Discharge status: Home / Self Care | Payer: BC Managed Care – PPO | Source: Ambulatory Visit | Attending: Family Medicine | Admitting: Family Medicine

## 2020-08-23 ENCOUNTER — Other Ambulatory Visit: Payer: Self-pay

## 2020-08-23 DIAGNOSIS — R928 Other abnormal and inconclusive findings on diagnostic imaging of breast: Secondary | ICD-10-CM

## 2020-08-30 ENCOUNTER — Ambulatory Visit: Payer: BC Managed Care – PPO | Admitting: Gastroenterology

## 2020-09-04 IMAGING — MR MR ANKLE*L* W/O CM
4 of 5 series · 12 of 40 positions shown · non-contrast
Comparison: MRI of the left ankle 11/01/2018.

CLINICAL DATA: Left ankle pain and swelling for 6 months. No known
injury.

EXAM:
MRI OF THE LEFT ANKLE WITHOUT CONTRAST
TECHNIQUE: Multiplanar, multisequence MR imaging of the ankle was performed. No
intravenous contrast was administered.

[Series 3: PD fat-sat · axial · left · 3.0mm · 0.25mm/px · z∈[-76,+4]mm · 3 of 30 slices shown]
[im 5/30]
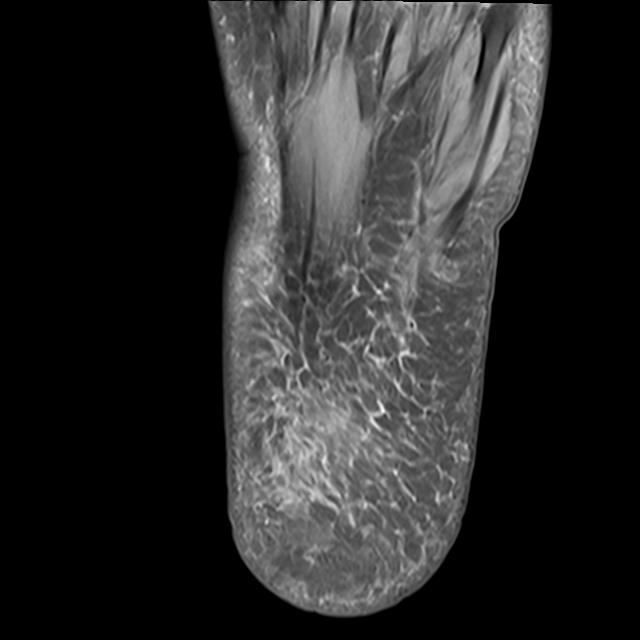
[im 17/30]
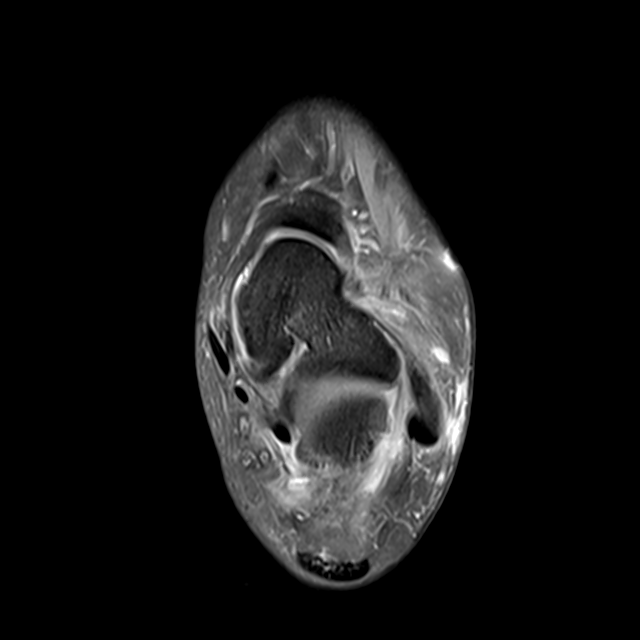
[im 25/30]
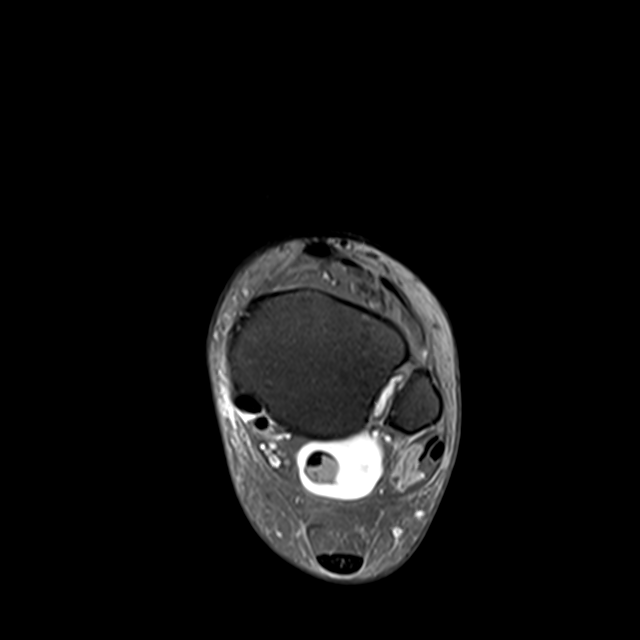

[Series 4: T2 fat-sat · axial · left · 3.0mm · 0.25mm/px · z∈[-80,+8]mm · 3 of 30 slices shown (1 of 2)]
[im 4/30]
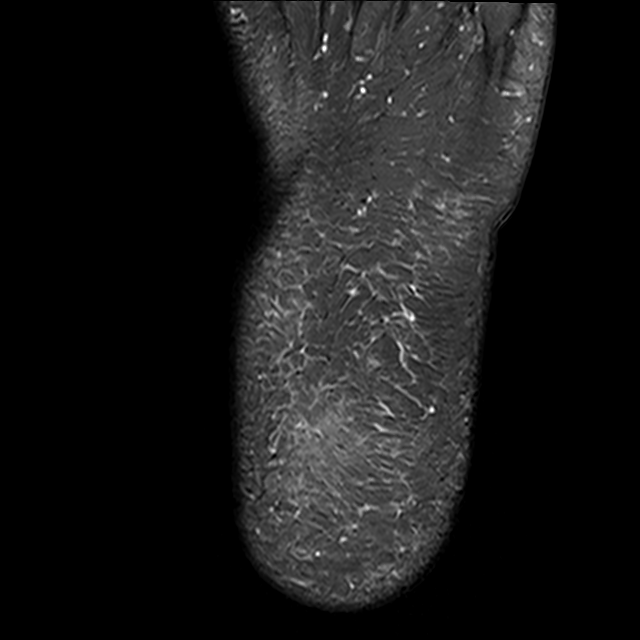
[im 15/30]
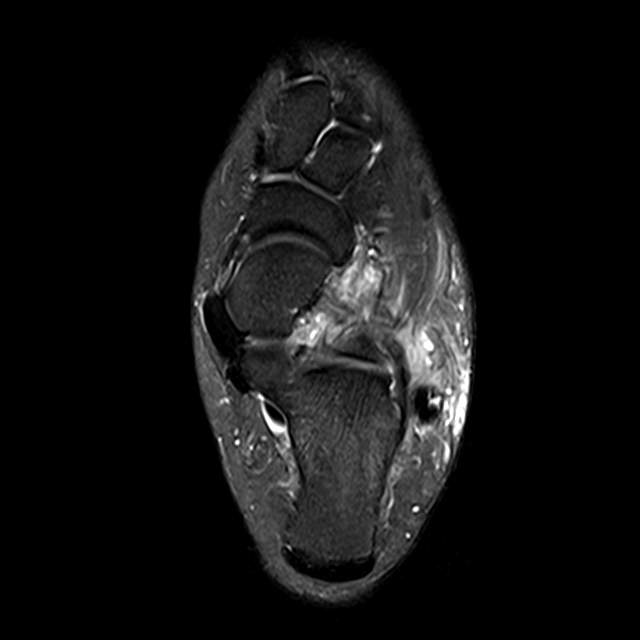
[im 26/30]
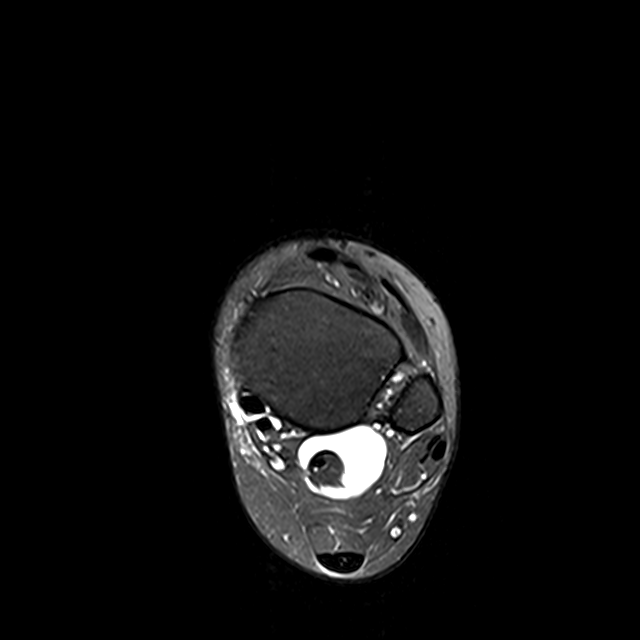

[Series 5: T1 · sagittal · left · 4.0mm · 0.27mm/px · 3 of 24 slices shown]
[im 4/24]
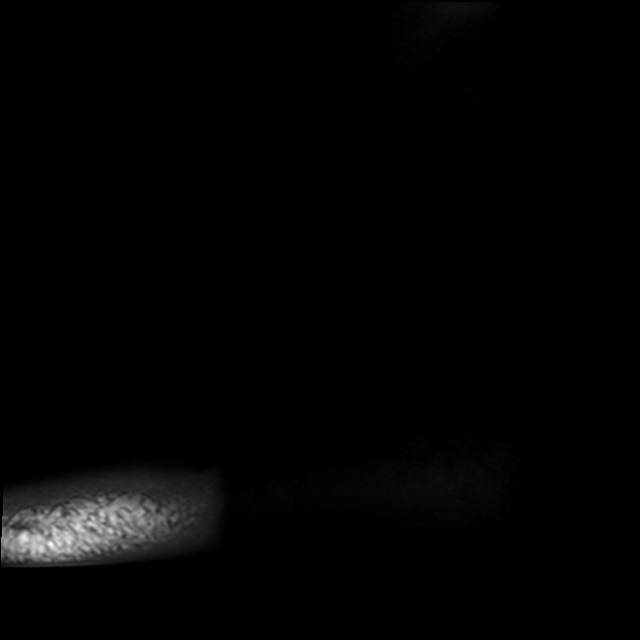
[im 12/24]
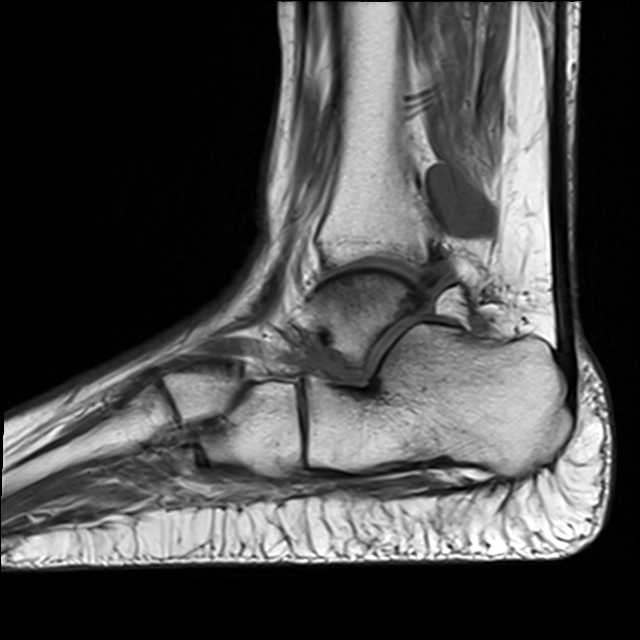
[im 20/24]
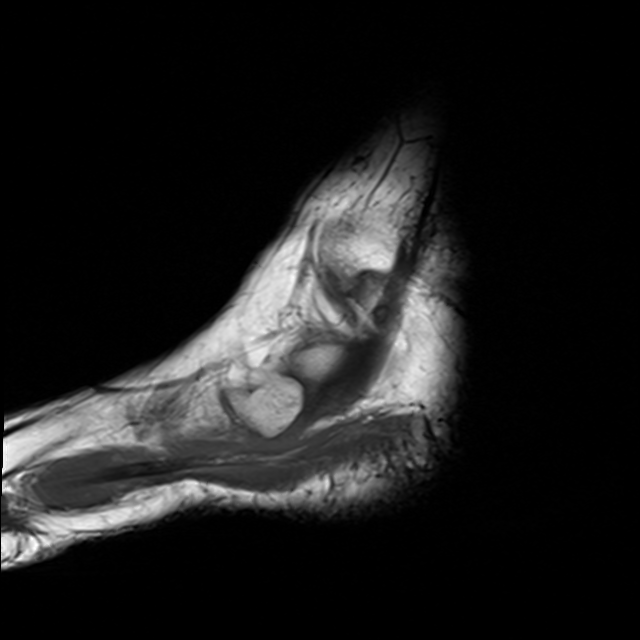

[Series 7: T2 fat-sat · coronal · left · 3.0mm · 0.25mm/px · 3 of 33 slices shown (2 of 2)]
[im 5/33]
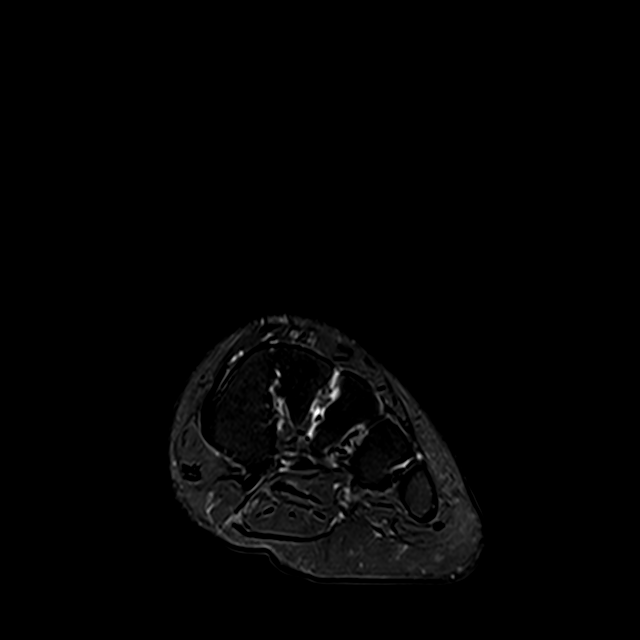
[im 17/33]
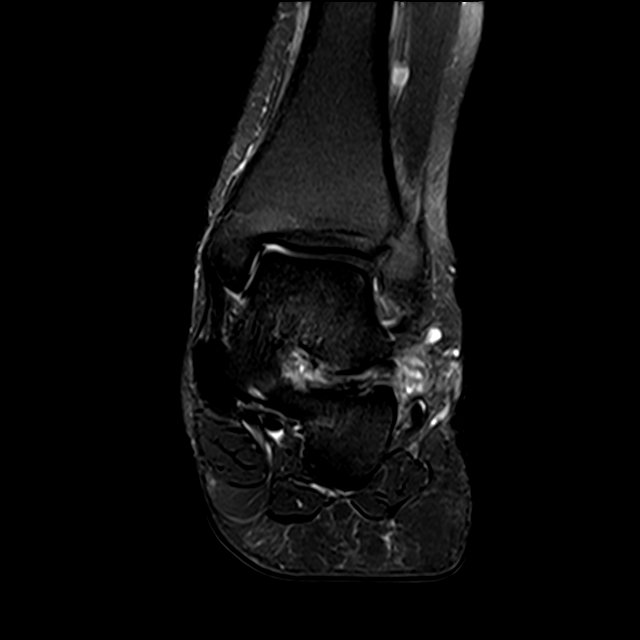
[im 29/33]
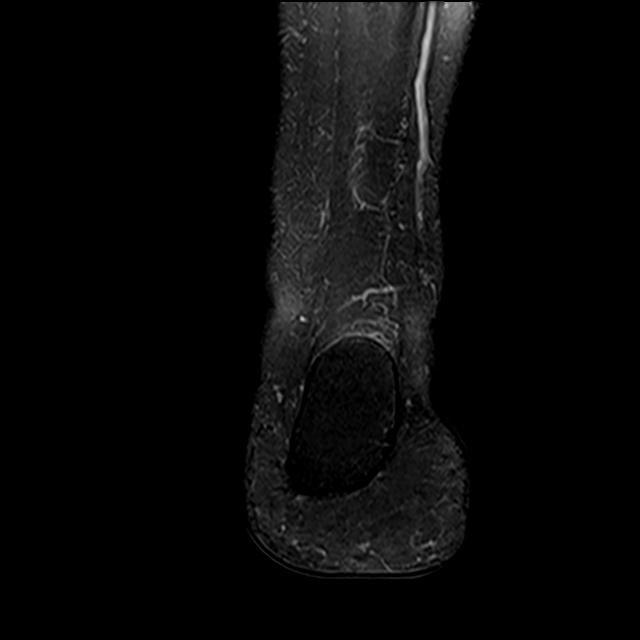

[12 of 40 positions shown; findings below may reference images not displayed]

FINDINGS: TENDONS

Peroneal: Intact.

Posteromedial: Fluid about the flexor hallucis longus posterior to
the distal tibia measuring approximately 2.1 cm AP x 1.5 cm
transverse x 3 cm craniocaudal is unchanged. The tendon is intact.
The tibialis posterior and flexor digitorum longus tendons appear
normal.

Anterior: Intact.

Achilles: Very mild intrasubstance increased T2 signal in the distal
tendon is noted. There is a trace amount of fluid in the
retrocalcaneal bursa.

Plantar Fascia: Intact with normal signal.

LIGAMENTS

Lateral: Intact.

Medial: Intact.

CARTILAGE

Ankle Joint: Osteochondral lesion of the medial talar dome measuring
0.6 cm transverse by 0.8 cm AP is again seen. Small underlying
subchondral cysts are identified and overlying cartilage is thinned
and irregular.

Subtalar Joints/Sinus Tarsi: Subchondral edema about the posterior
facet of the subtalar joint is again identified. Fat signal within
the sinus tarsi is obliterated and the roof of the sinus tarsi
appears mildly expanded.

Bones: Large os trigonum measuring 1.2 cm transverse by 0.9 cm AP by
0.8 cm craniocaudal is again seen. The os trigonum is lateral and
contiguous with an elongated lateral tubercle of the talus (Stieda's
process) which measures 1 cm AP by 0.7 cm craniocaudal by 0.7 cm
transverse. There is edema within both the os and Stieda's process
and a small volume of fluid about both bones.

Other: None.
IMPRESSION: No marked change in the appearance of the ankle since the prior
examination.

Findings consistent with posterior ankle impingement with a large os
trigonum and elongated Stieda's process identified. Large volume of
fluid about the flexor hallucis longus posterior to the distal tibia
is most consistent with tendon entrapment.

Osteochondral lesion of the medial talar dome with findings
worrisome for fragment instability.

Subtalar osteoarthritis.

Findings worrisome for sinus tarsi syndrome.

Mild Achilles tendinopathy without tear. Trace amount of fluid in
the retrocalcaneal bursa noted.

## 2020-09-11 ENCOUNTER — Other Ambulatory Visit: Payer: Self-pay

## 2020-09-11 ENCOUNTER — Ambulatory Visit (INDEPENDENT_AMBULATORY_CARE_PROVIDER_SITE_OTHER): Payer: BC Managed Care – PPO | Admitting: Family Medicine

## 2020-09-11 ENCOUNTER — Encounter: Payer: Self-pay | Admitting: Family Medicine

## 2020-09-11 DIAGNOSIS — Z119 Encounter for screening for infectious and parasitic diseases, unspecified: Secondary | ICD-10-CM | POA: Diagnosis not present

## 2020-09-11 NOTE — Assessment & Plan Note (Signed)
Provided reassurance for infectious disease caused by saliva as patient does not have any viral symptoms.  Reassured her that HIV and hepatitis C is not spread with oral secretions.  Patient continued to seem uneasy about the event and reports that she has not seen any counselors.  Provided her a list of counselors in the area and recommended further follow-up should she continue to have issues with this.
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
## 2020-09-11 NOTE — Patient Instructions (Signed)
Therapy and Counseling Resources Most providers on this list will take Medicaid. Patients with commercial insurance or Medicare should contact their insurance company to get a list of in network providers.  [facility] [address] [place], [place] [address] [phone]  [facility]  [address]   [place], [place] [address]      [phone]  [facility] [address]  [place], [place] [address] [phone]  [facility] [address]  [place], [place] [address]       [phone]     [facility] (virtual only) [phone]  [facility] [address], [place], [place] [phone]  [facility]:  [address] [place] [phone]  [facility]:  [address], [place] [phone]  [facility] (under & uninsured) [address]   [place] [phone]    [email or web address]    [facility] [address]  [place]    [phone]  [facility] [place] -[address]     Phone:  [phone]     [place]-  [address]  [phone]   [facility] [address]      [place], [place] [phone]  [facility]: [address], [place], [place]  [phone]   [facility] (Spanish therapist) [email or web address]  [address]   [place] [address]    [phone]    [facility]   [address],  [place], [place]  [phone]   [facility]  [address] [place]  [phone]  [facility]  [address] [place], [place]        [phone]  Open Access/Walk In Clinic under & uninsured  [facility]  [address] [place], [place]  [phone] Crisis [phone]  [facility] [place],  (Spanish)   [address], [place]: ([phone]) 8:30 - 12; 1 - 2:30  [facility],  [address], [place]    ([phone]):8:30 - 12; 2 - 3PM  [facility] [place],  [address],  [place]; ([phone]):   Mon - Fri 8 AM - 5 PM  [facility] [address] [place]  MWF 12:30 to 3:00 or call to schedule an appointment  [phone]  Specific Provider options Psychology Today  [email or web address].psychologytoday.com/us click on find a therapist  enter your zip code left side and select or tailor a therapist for your specific need.   [facility] Provider Directory [email or web address]  (Medicaid)   Follow all drop down to find a provider  Social Support program [facility] [phone] or www.mhag.org [address], [place], [place] Recovery support and educational   24- Hour Availability:   [facility]  [address] [place], [place] [phone] Crisis [phone]  [facility]  Crisis Line [phone]  [facility]  [phone]   [facility] [place] [facility]  [phone] (after hours)  Therapeutic Alternative/Mobile Crisis   1-877-626-1772  [place] National Suicide Hotline  1-800-273-8255 (TALK)  Call 911 or go to emergency room  [facility]  (800-256-2452);  [place]   [facility]  ([phone]); [place],   [place], [place], [place], [place], [place], [place]  

## 2020-09-27 ENCOUNTER — Ambulatory Visit (INDEPENDENT_AMBULATORY_CARE_PROVIDER_SITE_OTHER): Payer: BC Managed Care – PPO | Admitting: Physician Assistant

## 2020-09-27 ENCOUNTER — Encounter: Payer: Self-pay | Admitting: Physician Assistant

## 2020-09-27 VITALS — BP 124/82 | HR 72 | Ht 67.0 in | Wt 189.8 lb

## 2020-09-27 DIAGNOSIS — Z1211 Encounter for screening for malignant neoplasm of colon: Secondary | ICD-10-CM

## 2020-09-27 DIAGNOSIS — Z800 Family history of malignant neoplasm of digestive organs: Secondary | ICD-10-CM | POA: Diagnosis not present

## 2020-09-27 MED ORDER — PLENVU 140 G PO SOLR: 1.0000 | ORAL | 0 refills | Status: DC

## 2020-09-27 NOTE — Progress Notes (Signed)
Chief Complaint: Family history of colon cancer  HPI:    Jenna Yoder is a 45 year old African-American female with a past medical history as listed below, who was referred to me by Allayne Stack, DO for a complaint of family history of colon cancer.      Today, the patient presents to clinic and explains that she has had 2 direct family members with colon cancer.  Her mother was diagnosed in her 49s and eventually died at the age of 13 from colon cancer.  Her brother passed away at the age of 84 from colon cancer.  She tells me that she has no GI complaints or concerns but just wanted to get a colonoscopy due to her family history.    Denies fever, chills, weight loss, abdominal pain, blood in her stool, nausea, vomiting, heartburn or reflux.  Past Medical History:  Diagnosis Date   Anemia 11/13/2015   Dentalgia    HTN (hypertension)    UTI (lower urinary tract infection)     Past Surgical History:  Procedure Laterality Date   CESAREAN SECTION     TUBAL LIGATION      Current Outpatient Medications  Medication Sig Dispense Refill   Fluticasone Propionate 0.05 % LOTN Apply 1 application topically daily. 60 mL 1   omeprazole (PRILOSEC) 20 MG capsule TAKE ONE CAPSULE BY MOUTH TWICE A DAY BEFORE MEAL(S) 60 capsule 3   No current facility-administered medications for this visit.    Allergies as of 09/27/2020   (No Known Allergies)    Family History  Problem Relation Age of Onset   Diabetes Mother    Hypertension Mother    Colon cancer Mother    Prostate cancer Father    Diabetes Sister    Hypertension Brother    Colon cancer Brother    Stomach cancer Neg Hx    Liver disease Neg Hx    Pancreatic cancer Neg Hx    Esophageal cancer Neg Hx     Social History   Socioeconomic History   Marital status: Married    Spouse name: Not on file   Number of children: Not on file   Years of education: Not on file   Highest education level: Not on file  Occupational History    Not on file  Tobacco Use   Smoking status: Never    Passive exposure: Never   Smokeless tobacco: Never  Vaping Use   Vaping Use: Never used  Substance and Sexual Activity   Alcohol use: Yes    Comment: occ   Drug use: No   Sexual activity: Yes    Birth control/protection: Surgical  Other Topics Concern   Not on file  Social History Narrative   Not on file   Social Determinants of Health   Financial Resource Strain: Not on file  Food Insecurity: Not on file  Transportation Needs: Not on file  Physical Activity: Not on file  Stress: Not on file  Social Connections: Not on file  Intimate Partner Violence: Not on file    Review of Systems:    Constitutional: No weight loss, fever or chills Skin: No rash  Cardiovascular: No chest pain Respiratory: No SOB  Gastrointestinal: See HPI and otherwise negative Genitourinary: No dysuria Neurological: No headache, dizziness or syncope Musculoskeletal: No new muscle or joint pain Hematologic: No bleeding  Psychiatric: No history of depression or anxiety   Physical Exam:  Vital signs: BP 124/82   Pulse 72   Ht 5'  7" (1.702 m)   Wt 189 lb 12.8 oz (86.1 kg)   BMI 29.73 kg/m   Constitutional:   Pleasant AA female appears to be in NAD, Well developed, Well nourished, alert and cooperative Head:  Normocephalic and atraumatic. Eyes:   PEERL, EOMI. No icterus. Conjunctiva pink. Ears:  Normal auditory acuity. Neck:  Supple Throat: Oral cavity and pharynx without inflammation, swelling or lesion.  Respiratory: Respirations even and unlabored. Lungs clear to auscultation bilaterally.   No wheezes, crackles, or rhonchi.  Cardiovascular: Normal S1, S2. No MRG. Regular rate and rhythm. No peripheral edema, cyanosis or pallor.  Gastrointestinal:  Soft, nondistended, nontender. No rebound or guarding. Normal bowel sounds. No appreciable masses or hepatomegaly. Rectal:  Not performed.  Msk:  Symmetrical without gross deformities. Without  edema, no deformity or joint abnormality.  Neurologic:  Alert and  oriented x4;  grossly normal neurologically.  Skin:   Dry and intact without significant lesions or rashes. Psychiatric: Demonstrates good judgement and reason without abnormal affect or behaviors.  No recent labs.  Assessment: 1.  Family history of colon cancer: In patient's mother-passed away at the age of 49 and brother diagnosed at the age of 71 and passed away  Plan: 1.  Scheduled patient for screening colonoscopy given family history of colon cancer.  This was scheduled with Dr. Myrtie Neither as he is supervising this afternoon.  Did provide the patient with a detailed list of risks for the procedure and she agrees to proceed. 2.  Patient to follow in clinic per recommendations from Dr. Myrtie Neither after time of procedure.  Hyacinth Meeker, PA-C [facility] 09/27/2020, 3:39 PM  Cc: Allayne Stack, DO

## 2020-09-27 NOTE — Patient Instructions (Signed)
It was my pleasure to provide care to you today. Based on our discussion, I am providing you with my recommendations below:  RECOMMENDATION(S):   You have been scheduled for a colonoscopy. Please follow written instructions given to you at your visit today.   PREP:   Please pick up your prep supplies at the pharmacy within the next 1-3 days.  INHALERS:   If you use inhalers (even only as needed), please bring them with you on the day of your procedure.  COLONOSCOPY TIPS:  To reduce nausea and dehydration, stay well hydrated for 3-4 days prior to the exam.  To prevent skin/hemorrhoid irritation - prior to wiping, put A&Dointment or vaseline on the toilet paper. Keep a towel or pad on the bed.  BEFORE STARTING YOUR PREP, drink  64oz of clear liquids in the morning. This will help to flush the colon and will ensure you are well hydrated!!!!  NOTE - This is in addition to the fluids required for to complete your prep. Use of a flavored hard candy, such as grape Rubin Payor, can counteract some of the flavor of the prep and may prevent some nausea.   FOLLOW UP:  After your procedure, you will receive a call from my office staff regarding my recommendation for follow up.  BMI:  If you are age 27 or younger, your body mass index should be between 19-25. Your Body mass index is 29.73 kg/m. If this is out of the aformentioned range listed, please consider follow up with your Primary Care Provider.   MY CHART:  The [facility] providers would like to encourage you to use [facility] to communicate with providers for non-urgent requests or questions.  Due to long hold times on the telephone, sending your provider a message by [facility] may be a faster and more efficient way to get a response.  Please allow 48 business hours for a response.  Please remember that this is for non-urgent requests.   Thank you for trusting me with your gastrointestinal care!    Carmel Sacramento, PA

## 2020-10-01 ENCOUNTER — Telehealth: Payer: Self-pay

## 2020-10-01 ENCOUNTER — Telehealth: Payer: Self-pay | Admitting: Genetic Counselor

## 2020-10-01 DIAGNOSIS — Z800 Family history of malignant neoplasm of digestive organs: Secondary | ICD-10-CM

## 2020-10-01 NOTE — Progress Notes (Signed)
____________________________________________________________  Attending physician addendum:  Thank you for sending this case to me. I have reviewed the entire note and agree with the plan.  She also needs a referral for genetic testing.  Amada Jupiter, MD  ____________________________________________________________

## 2020-10-01 NOTE — Telephone Encounter (Signed)
Referral has been made to genetics.

## 2020-10-01 NOTE — Telephone Encounter (Signed)
Scheduled appt per referral. Pt aware.  

## 2020-10-01 NOTE — Telephone Encounter (Signed)
-----   Message from Unk Lightning, [place] sent at 10/01/2020 12:53 PM EDT ----- Regarding: genetic referral Can you send patient for genetic referral given family history of colon cancer. Thx-JLL ----- Message ----- From: Sherrilyn Rist, MD Sent: 10/01/2020  12:44 PM EDT To: Unk Lightning, PA     ----- Message ----- From: Unk Lightning, [place] Sent: 09/27/2020   3:44 PM EDT To: Sherrilyn Rist, MD

## 2020-10-11 ENCOUNTER — Other Ambulatory Visit: Payer: Self-pay

## 2020-10-11 ENCOUNTER — Inpatient Hospital Stay: Payer: BC Managed Care – PPO

## 2020-10-11 ENCOUNTER — Inpatient Hospital Stay: Payer: BC Managed Care – PPO | Attending: Genetic Counselor | Admitting: Genetic Counselor

## 2020-10-11 DIAGNOSIS — Z800 Family history of malignant neoplasm of digestive organs: Secondary | ICD-10-CM

## 2020-10-11 DIAGNOSIS — Z8042 Family history of malignant neoplasm of prostate: Secondary | ICD-10-CM

## 2020-10-15 ENCOUNTER — Encounter: Payer: Self-pay | Admitting: Genetic Counselor

## 2020-10-15 DIAGNOSIS — Z800 Family history of malignant neoplasm of digestive organs: Secondary | ICD-10-CM | POA: Insufficient documentation

## 2020-10-15 DIAGNOSIS — Z8042 Family history of malignant neoplasm of prostate: Secondary | ICD-10-CM | POA: Insufficient documentation

## 2020-10-15 NOTE — Progress Notes (Signed)
REFERRING PROVIDER: Doran Stabler, MD [address] [place],  [place] [address]  PRIMARY PROVIDER:  Eppie Gibson, MD  PRIMARY REASON FOR VISIT:  1. FHx: colon cancer   2. Family history of prostate cancer   3. Family history of throat cancer      HISTORY OF PRESENT ILLNESS:   Jenna Yoder, a 45 y.o. female, was seen for a [place] cancer genetics consultation at the request of Dr. Loletha Carrow due to a family history of cancer.  Jenna Yoder presents to clinic today to discuss the possibility of a hereditary predisposition to cancer, genetic testing, and to further clarify her future cancer risks, as well as potential cancer risks for family members.   Jenna Yoder does not have a personal history of cancer.    RISK FACTORS:  Menarche was at age 76.  First live birth at age 66.  OCP use for approximately 1 years.  Ovaries intact: yes.  Hysterectomy: no.  Menopausal status: premenopausal.  HRT use: 0 years. Colonoscopy: no, scheduled 10/29/20. Mammogram within the last year: yes. Number of breast biopsies: 0. Any excessive radiation exposure in the past: no.  Past Medical History:  Diagnosis Date   Anemia 11/13/2015   Dentalgia    Family history of colon cancer    Family history of prostate cancer    Family history of throat cancer    HTN (hypertension)    UTI (lower urinary tract infection)     Past Surgical History:  Procedure Laterality Date   CESAREAN SECTION     TUBAL LIGATION      Social History   Socioeconomic History   Marital status: Married    Spouse name: Not on file   Number of children: Not on file   Years of education: Not on file   Highest education level: Not on file  Occupational History   Not on file  Tobacco Use   Smoking status: Never    Passive exposure: Never   Smokeless tobacco: Never  Vaping Use   Vaping Use: Never used  Substance and Sexual Activity   Alcohol use: Yes    Comment: occ   Drug use: No   Sexual activity: Yes     Birth control/protection: Surgical  Other Topics Concern   Not on file  Social History Narrative   Not on file   Social Determinants of Health   Financial Resource Strain: Not on file  Food Insecurity: Not on file  Transportation Needs: Not on file  Physical Activity: Not on file  Stress: Not on file  Social Connections: Not on file     FAMILY HISTORY:  We obtained a detailed, 4-generation family history.  Significant diagnoses are listed below: Family History  Problem Relation Age of Onset   Diabetes Mother    Hypertension Mother    Colon cancer Mother        dx 55s   Heart attack Mother 37   Prostate cancer Father        dx 92s   Diabetes Sister    Hypertension Brother    Colon cancer Brother 84   Throat cancer Maternal Grandfather        dx ?   Stomach cancer Neg Hx    Liver disease Neg Hx    Pancreatic cancer Neg Hx    Esophageal cancer Neg Hx    Jenna Yoder has one daughter (age 31) and two sons (ages 22 and 49). She had  two full-brothers, two full-sisters, and one maternal half-sister. One brother was diagnosed with colon cancer at age 17 and died at age 66.  Jenna Yoder mother died at age 96 and had a history of colon cancer in her 22s. There was one maternal aunt and one maternal uncle. There is no known cancer among maternal aunts/uncles or maternal cousins. Jenna Yoder maternal grandmother died in her 61s without cancer. Her maternal grandfather died in his 68s and had a history of throat cancer.   Jenna Yoder father died in his 64s and had prostate cancer diagnosed in his 70s. There were four paternal aunts and four paternal uncles. There is no known cancer among paternal aunts/uncles or paternal cousins. Jenna Yoder paternal grandmother is alive at age 48 without cancer. Her paternal grandfather died in his 18s without cancer.  Jenna Yoder is unaware of previous family history of genetic testing for hereditary cancer risks. Patient's maternal and paternal  ancestors are of Black/African American and otherwise unknown descent. There is no reported Ashkenazi Jewish ancestry. There is no known consanguinity.  GENETIC COUNSELING ASSESSMENT: Jenna Yoder is a 45 y.o. female with a family history of cancer which is somewhat suggestive of a hereditary cancer syndrome and predisposition to cancer. We, therefore, discussed and recommended the following at today's visit.   DISCUSSION: We discussed that approximately 5-10% of cancer is hereditary, with most cases of hereditary colorectal cancer associated with Lynch syndrome. There are other genes that can be associated with hereditary colorectal cancer syndromes. These include APC, MUTYH, CHEK2, etc. We discussed that testing is beneficial for several reasons, including knowing about other cancer risks, identifying potential screening and risk-reduction options that may be appropriate, and to understand if other family members could be at risk for cancer and allow them to undergo genetic testing.   We reviewed the characteristics, features and inheritance patterns of hereditary cancer syndromes. We also discussed genetic testing, including the appropriate family members to test, the process of testing, insurance coverage and turn-around-time for results. We discussed the implications of a negative, positive and/or variant of uncertain significant result. We recommended Jenna Yoder pursue genetic testing for the Common Hereditary Cancers + RNA gene panel.   The Common Hereditary Cancers Panel offered by [facility] includes sequencing and/or deletion duplication testing of the following 47genes: APC*, ATM*, AXIN2*, BARD1*, BMPR1A*, BRCA1*, BRCA2*, BRIP1*, CDH1*, CDK4, CDKN2A (p14ARF), CDKN2A (p16INK4a), CHEK2*, CTNNA1*, DICER1*, EPCAM (Deletion/duplication testing only), GREM1 (promoter region deletion/duplication testing only), KIT, MEN1*, MLH1*, MSH2*, MSH3*, MSH6*, MUTYH*, NBN*, NF1*, NTHL1, PALB2*, PDGFRA, PMS2*, POLD1*,  POLE*, PTEN*, RAD50*, RAD51C*, RAD51D*, SDHB*, SDHC*, SDHD*, SMAD4*, SMARCA4*, STK11*, TP53*, TSC1*, TSC2*, and VHL*.  The following genes were evaluated for sequence changes only: SDHA* and HOXB13 c.251G>A variant only. RNA analysis performed for * genes.  Based on Jenna Yoder's family history of cancer, she meets medical criteria for genetic testing. Despite that she meets criteria, there may still be an out of pocket cost.   We discussed that some people do not want to undergo genetic testing due to fear of genetic discrimination. A federal law called the Genetic Information Non-Discrimination Act (GINA) of 2008 helps protect individuals against genetic discrimination based on their genetic test results.  It impacts both health insurance and employment. With health insurance, it protects against increased premiums, being kicked off insurance or being forced to take a test in order to be insured. For employment it protects against hiring, firing and promoting decisions based on genetic test results.  Health  status due to a cancer diagnosis is not protected under GINA. Additionally, life, disability, and long-term care insurance is not protected under GINA.  PLAN:  Jenna Yoder did not wish to pursue genetic testing at today's visit, as she would like to first consider life insurance policies. We understand this decision and remain available to coordinate genetic testing at any time in the future. We, therefore, recommend Jenna Yoder continue to follow the cancer screening guidelines given by her primary healthcare provider.  Based on Jenna Yoder's family history, we recommended her maternal family members have genetic counseling and testing. Ms. Diviney will let us know if we can be of any assistance in coordinating genetic counseling and/or testing for these family members.   Ms. Ambrose questions were answered to her satisfaction today. Our contact information was provided should additional questions or  concerns arise. Thank you for the referral and allowing [place] to share in the care of your patient.   Clint Guy, [place], [facility] Licensed, Certified Dispensing [email or web address] Phone: [phone]  The patient was seen for a total of 30 minutes in face-to-face genetic counseling. Patient was seen alone. This patient was discussed with Drs. Magrinat, Lindi Adie and/or Burr Medico who agrees with the above.    _______________________________________________________________________ For Office Staff:  Number of people involved in session: 1 Was an Intern/ student involved with case: no

## 2020-10-15 NOTE — Progress Notes (Signed)
Thank you for the note.  - H. Danis

## 2020-10-29 ENCOUNTER — Encounter: Payer: BC Managed Care – PPO | Admitting: Gastroenterology

## 2020-11-16 ENCOUNTER — Other Ambulatory Visit: Payer: Self-pay | Admitting: *Deleted

## 2020-11-16 DIAGNOSIS — K219 Gastro-esophageal reflux disease without esophagitis: Secondary | ICD-10-CM

## 2020-11-19 MED ORDER — OMEPRAZOLE 20 MG PO CPDR: DELAYED_RELEASE_CAPSULE | ORAL | 3 refills | Status: DC

## 2021-06-13 ENCOUNTER — Ambulatory Visit: Payer: BC Managed Care – PPO | Admitting: Podiatry

## 2021-06-13 ENCOUNTER — Encounter: Payer: Self-pay | Admitting: Podiatry

## 2021-06-13 DIAGNOSIS — Q688 Other specified congenital musculoskeletal deformities: Secondary | ICD-10-CM

## 2021-06-13 DIAGNOSIS — M7752 Other enthesopathy of left foot: Secondary | ICD-10-CM

## 2021-06-13 MED ORDER — TRIAMCINOLONE ACETONIDE 40 MG/ML IJ SUSP
20.0000 mg | Freq: Once | INTRAMUSCULAR | Status: AC
  Administered 2021-06-13: 20 mg

## 2021-06-13 NOTE — Progress Notes (Signed)
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  She states that is been feeling okay but noticed this started to swell some and usually after the swelling the joint really started to get sore so wanted to be proactive.  She states that she was let go from [employer] for an incident with a customer and has not been walking on it quite as much so that is the reason that we have seen each other in almost a year. ? ?Objective: Vital signs are stable she is alert oriented x3 she has pain on sharp plantarflexion in the posterior compartment of the ankle joint.  She also has pain with dorsiflexion of the hallux.  She also has pain on deep palpation posterior lateral aspect of the subtalar joint area. ? 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 ? ?Plan: Discussed etiology pathology and surgical therapies at this point we go ahead and inject the area with 10 mg Kenalog 5 mg Marcaine point maximal tenderness.  She tolerated procedure well without complications I will follow-up with her on an as-needed basis. ?

## 2021-06-20 ENCOUNTER — Ambulatory Visit: Payer: BC Managed Care – PPO | Admitting: Podiatry

## 2021-06-24 ENCOUNTER — Ambulatory Visit (INDEPENDENT_AMBULATORY_CARE_PROVIDER_SITE_OTHER): Payer: BC Managed Care – PPO

## 2021-06-24 DIAGNOSIS — M7751 Other enthesopathy of right foot: Secondary | ICD-10-CM | POA: Diagnosis not present

## 2021-06-24 DIAGNOSIS — M7752 Other enthesopathy of left foot: Secondary | ICD-10-CM | POA: Diagnosis not present

## 2021-06-24 DIAGNOSIS — Q688 Other specified congenital musculoskeletal deformities: Secondary | ICD-10-CM

## 2021-06-24 NOTE — Progress Notes (Signed)
SITUATION ?Reason for Consult: Evaluation for Bilateral Custom Foot Orthoses ?Patient / Caregiver Report: Patient is ready for foot orthotics ? ?OBJECTIVE DATA: ?Patient History / Diagnosis:  ?  ICD-10-CM   ?1. Capsulitis of left ankle  M77.52   ?  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  ? ? ?Current or Previous Devices:   Historical user ? ?Foot Examination: ?Skin presentation:   Intact ?Ulcers & Callousing:   None ?Toe / Foot Deformities:  None ?Weight Bearing Presentation:  Rectus ?Sensation:    Intact ? ?Shoe Size:    56M ? ?ORTHOTIC RECOMMENDATION ?Recommended Device: 1x pair of custom functional foot orthotics ? ?GOALS OF ORTHOSES ?- Reduce Pain ?- Prevent Foot Deformity ?- Prevent Progression of Further Foot Deformity ?- Relieve Pressure ?- Improve the Overall Biomechanical Function of the Foot and Lower Extremity. ? ?ACTIONS PERFORMED ?Potential out of pocket cost was communicated to patient. Patient understood and consent to casting. Patient was casted for Foot Orthoses via crush box. Procedure was explained and patient tolerated procedure well. Casts were shipped to central fabrication. All questions were answered and concerns addressed. ? ?PLAN ?Patient is to be called for fitting when devices are ready.  ? ? ?

## 2021-07-17 ENCOUNTER — Ambulatory Visit: Payer: BC Managed Care – PPO

## 2021-07-17 DIAGNOSIS — M7752 Other enthesopathy of left foot: Secondary | ICD-10-CM

## 2021-07-17 NOTE — Progress Notes (Signed)
SITUATION: ?Reason for Visit: Fitting and Delivery of Custom Fabricated Foot Orthoses ?Patient Report: Patient reports comfort and is satisfied with device. ? ?OBJECTIVE DATA: ?Patient History / Diagnosis:   ?  ICD-10-CM   ?1. Capsulitis of left ankle  M77.52   ?  ? ? ?Provided Device:  Custom Functional Foot Orthotics ?    [employer]: [id number] ? ?GOAL OF ORTHOSIS ?- Improve gait ?- Decrease energy expenditure ?- Improve Balance ?- Provide Triplanar stability of foot complex ?- Facilitate motion ? ?ACTIONS PERFORMED ?Patient was fit with foot orthotics trimmed to shoe last. Patient tolerated fittign procedure.  ? ?Patient was provided with verbal and written instruction and demonstration regarding donning, doffing, wear, care, proper fit, function, purpose, cleaning, and use of the orthosis and in all related precautions and risks and benefits regarding the orthosis. ? ?Patient was also provided with verbal instruction regarding how to report any failures or malfunctions of the orthosis and necessary follow up care. Patient was also instructed to contact our office regarding any change in status that may affect the function of the orthosis. ? ?Patient demonstrated independence with proper donning, doffing, and fit and verbalized understanding of all instructions. ? ?PLAN: ?Patient is to follow up in one week or as necessary (PRN). All questions were answered and concerns addressed. Plan of care was discussed with and agreed upon by the patient. ? ?

## 2021-07-30 ENCOUNTER — Encounter: Payer: Self-pay | Admitting: Family Medicine

## 2021-07-30 ENCOUNTER — Ambulatory Visit (INDEPENDENT_AMBULATORY_CARE_PROVIDER_SITE_OTHER): Payer: BC Managed Care – PPO | Admitting: Family Medicine

## 2021-07-30 ENCOUNTER — Other Ambulatory Visit: Payer: BC Managed Care – PPO

## 2021-07-30 ENCOUNTER — Ambulatory Visit
Admission: RE | Admit: 2021-07-30 | Discharge: 2021-07-30 | Disposition: A | Discharge status: Home / Self Care | Payer: BC Managed Care – PPO | Source: Ambulatory Visit | Attending: Family Medicine | Admitting: Family Medicine

## 2021-07-30 VITALS — BP 135/89 | HR 60 | Ht 67.0 in | Wt 205.4 lb

## 2021-07-30 DIAGNOSIS — M545 Low back pain, unspecified: Secondary | ICD-10-CM

## 2021-07-30 DIAGNOSIS — M5136 Other intervertebral disc degeneration, lumbar region: Secondary | ICD-10-CM | POA: Diagnosis not present

## 2021-07-30 NOTE — Progress Notes (Unsigned)
    SUBJECTIVE:   CHIEF COMPLAINT / HPI:   Low back pain X3 days, 1 day after the start of her menstrual period. She has a history of fibroids and wonders if this could be related. Denies trauma/falls and recent heavy lifting but states this has happened before years ago. She feels as if something is slipping in her back every time she attempts to move. She can barely move and needs assistance to the restroom. She is normally independent. She has tried a heating pad and 800 mg ibuprofen every 6  hours without relief. She denies fever, numbness and tingling in her legs, and saddle anesthesia.   PERTINENT  PMH / PSH: As above.   OBJECTIVE:   BP 135/89   Pulse 60   Ht 5\' 7"  (1.702 m)   Wt 205 lb 6.4 oz (93.2 kg)   SpO2 100%   BMI 32.17 kg/m   Physical Exam Vitals reviewed.  Constitutional:      General: She is in acute distress.     Appearance: She is not ill-appearing, toxic-appearing or diaphoretic.  Pulmonary:     Effort: Pulmonary effort is normal.  Musculoskeletal:     Lumbar back: Tenderness present. No bony tenderness. Decreased range of motion.     Comments: Sitting on the edge of the chair and using hands a support to brace herself to lean back. Difficult exam 2/2 pain and decreased ROM as well as patient's apprehension to participate due to pain. Seems to be more in pain with attempting to rise from a seated position than when I am palpating over the lumbar region.   Neurological:     General: No focal deficit present.     Mental Status: She is alert and oriented to person, place, and time.     Sensory: No sensory deficit.     Gait: Gait abnormal.   ASSESSMENT/PLAN:   Acute bilateral low back pain without sciatica 3 days of worsening back pain without clear etiology. She is reluctant to sit, moving slowly, needing assistance to toilet, without radiculopathy, and pain seems out of proportion to palpatory exam. Suspect MSK but highly consider a deeper possibly spinal or  deeper organ etiology such as referred pelvic pain. Reviewed 2017 CT abd/pelvis which showed an 8cm ovarian cyst and adhesions with the myometrium and abdominal wall. To further evaluate this acute low back pain I will obtain plain films of the lumbar spine and the labs below. If persistent and continues to show association surrounding menstrual cycle highly consider pelvic 2018.  - DG Lumbar Spine 2-3 Views; Future - CBC; Future - Urinalysis; Future  [place], DO [facility]

## 2021-07-30 NOTE — Patient Instructions (Signed)
I am sending you to get images of your back.  Please come back at 130 to get your labs drawn.  Continue to use ice and ibuprofen if this helps.  If back pains worsens please let us know otherwise follow-up in 1 week.

## 2021-07-31 LAB — CBC
Hematocrit: 36.7 % (ref 34.0–46.6)
Hemoglobin: 11 g/dL — ABNORMAL LOW (ref 11.1–15.9)
MCH: 23.4 pg — ABNORMAL LOW (ref 26.6–33.0)
MCHC: 30 g/dL — ABNORMAL LOW (ref 31.5–35.7)
MCV: 78 fL — ABNORMAL LOW (ref 79–97)
Platelets: 322 10*3/uL (ref 150–450)
RBC: 4.7 x10E6/uL (ref 3.77–5.28)
RDW: 17 % — ABNORMAL HIGH (ref 11.7–15.4)
WBC: 4.7 10*3/uL (ref 3.4–10.8)

## 2021-07-31 LAB — URINALYSIS
Bilirubin, UA: NEGATIVE
Glucose, UA: NEGATIVE
Ketones, UA: NEGATIVE
Leukocytes,UA: NEGATIVE
Nitrite, UA: NEGATIVE
Specific Gravity, UA: 1.026 (ref 1.005–1.030)
Urobilinogen, Ur: 0.2 mg/dL (ref 0.2–1.0)
pH, UA: 5.5 (ref 5.0–7.5)

## 2021-08-01 ENCOUNTER — Telehealth: Payer: Self-pay | Admitting: Family Medicine

## 2021-08-01 DIAGNOSIS — M545 Low back pain, unspecified: Secondary | ICD-10-CM | POA: Insufficient documentation

## 2021-08-01 DIAGNOSIS — N838 Other noninflammatory disorders of ovary, fallopian tube and broad ligament: Secondary | ICD-10-CM

## 2021-08-01 NOTE — Telephone Encounter (Signed)
Patient returns call to nurse line.   Patient advised of results and the need for FU [place] for hx of ovarian mass.  Patient would like to move forward with [place].   Will forward back to provider.

## 2021-08-01 NOTE — Telephone Encounter (Signed)
Called patient. No answer. LVM. Will also send mychart message. No acute fracture or dislocation. Reviewed prior US; would like to get follow up ultrasound; hx of 8cm ovarian mass. Would like to discuss further with patient if she calls back.   Deb Loudin Autry-Lott, DO 08/01/2021, 10:18 AM PGY-3, [facility]

## 2021-08-06 NOTE — Telephone Encounter (Signed)
Called patient. Will move forward with [place]. Please scheduled and call patient with date and time. Order placed in this encounter. Thank you team.   Lavonda Jumbo, DO 08/06/2021, 4:37 PM PGY-3, [facility]

## 2021-08-06 NOTE — Addendum Note (Signed)
Addended by: Alonna Buckler on: 08/06/2021 04:39 PM   Modules accepted: Orders

## 2021-08-09 ENCOUNTER — Ambulatory Visit
Admission: RE | Admit: 2021-08-09 | Discharge: 2021-08-09 | Disposition: A | Discharge status: Home / Self Care | Payer: BC Managed Care – PPO | Source: Ambulatory Visit | Attending: Family Medicine | Admitting: Family Medicine

## 2021-08-09 DIAGNOSIS — N838 Other noninflammatory disorders of ovary, fallopian tube and broad ligament: Secondary | ICD-10-CM

## 2021-08-09 DIAGNOSIS — N83201 Unspecified ovarian cyst, right side: Secondary | ICD-10-CM | POA: Diagnosis not present

## 2021-08-13 ENCOUNTER — Encounter: Payer: Self-pay | Admitting: *Deleted

## 2021-09-09 ENCOUNTER — Encounter: Payer: Self-pay | Admitting: Student

## 2021-09-09 ENCOUNTER — Ambulatory Visit: Payer: BC Managed Care – PPO | Admitting: Student

## 2021-09-09 ENCOUNTER — Other Ambulatory Visit (HOSPITAL_COMMUNITY)
Admission: RE | Admit: 2021-09-09 | Discharge: 2021-09-09 | Disposition: A | Discharge status: Home / Self Care | Payer: BC Managed Care – PPO | Source: Ambulatory Visit | Attending: Family Medicine | Admitting: Family Medicine

## 2021-09-09 VITALS — BP 130/82 | HR 74 | Ht 67.0 in | Wt 202.0 lb

## 2021-09-09 DIAGNOSIS — Z13220 Encounter for screening for lipoid disorders: Secondary | ICD-10-CM

## 2021-09-09 DIAGNOSIS — Z23 Encounter for immunization: Secondary | ICD-10-CM | POA: Diagnosis not present

## 2021-09-09 DIAGNOSIS — Z124 Encounter for screening for malignant neoplasm of cervix: Secondary | ICD-10-CM

## 2021-09-09 DIAGNOSIS — K5900 Constipation, unspecified: Secondary | ICD-10-CM

## 2021-09-09 DIAGNOSIS — Z131 Encounter for screening for diabetes mellitus: Secondary | ICD-10-CM

## 2021-09-09 DIAGNOSIS — Z0000 Encounter for general adult medical examination without abnormal findings: Secondary | ICD-10-CM | POA: Diagnosis not present

## 2021-09-09 DIAGNOSIS — Z1231 Encounter for screening mammogram for malignant neoplasm of breast: Secondary | ICD-10-CM

## 2021-09-09 LAB — POCT GLYCOSYLATED HEMOGLOBIN (HGB A1C): Hemoglobin A1C: 5.4 % (ref 4.0–5.6)

## 2021-09-09 MED ORDER — POLYETHYLENE GLYCOL 3350 17 GM/SCOOP PO POWD: 17.0000 g | Freq: Every day | ORAL | 0 refills | Status: AC

## 2021-09-09 NOTE — Patient Instructions (Addendum)
Jenna Yoder,  It is such a joy to take care you! Thank you for coming in today.   As a reminder, here is a recap of what we talked about today:  - We collected your Pap smear, I will send you a MyChart message if it is normal and will call you if it is abnormal - I recommend you start taking MiraLAX (or the storebrand equivalent polyethylene glycol), 1-2 capfuls per day until your stools become softer - I am ordering a mammogram, you can call [facility] to schedule this - Please call your GI doctor at [phone] to schedule a colonoscopy given your family history  We are checking some labs today. I will call you if they are abnormal. I will send you a MyChart message or a letter if they are normal.  If you do not hear about your labs in the next 2 weeks please let us know.  Take care and seek immediate care sooner if you develop any concerns.   Eliezer Mccoy, MD [facility]

## 2021-09-09 NOTE — Progress Notes (Unsigned)
    SUBJECTIVE:   Chief compliant/HPI: annual examination  Jenna Yoder is a 46 y.o. who presents today for an annual exam and Pap smear.   Constipation 3 weeks of small round stool balls. Tried dietary changes, [employer] brand stool softener pills. Has not tried MiraLAX. No blood.   History tabs reviewed.   Review of systems form reviewed and notable for constipation as above.   OBJECTIVE:   BP 130/82   Pulse 74   Ht $R'5\' 7"'UB$  (1.702 m)   Wt 202 lb (91.6 kg)   LMP 08/31/2021   SpO2 98%   BMI 31.64 kg/m   Physical Exam Exam conducted with a chaperone present.  Constitutional:      General: She is not in acute distress. Cardiovascular:     Rate and Rhythm: Normal rate and regular rhythm.     Pulses: Normal pulses.     Heart sounds: Normal heart sounds.  Pulmonary:     Effort: Pulmonary effort is normal.     Breath sounds: Normal breath sounds.  Abdominal:     General: Abdomen is flat.     Palpations: Abdomen is soft.  Genitourinary:    Vagina: Normal. No vaginal discharge or tenderness.     Cervix: No discharge or friability.     Uterus: Normal.   Skin:    General: Skin is warm and dry.  Neurological:     General: No focal deficit present.      ASSESSMENT/PLAN:   Constipation - Advised to try MiraLAX, start at one capful/day and uptitrate from there    Annual Examination  See AVS for age appropriate recommendations.   PHQ score 0, reviewed and discussed.  Blood pressure reviewed and at goal yes.  Asked about intimate partner violence and resources given as appropriate  The patient currently uses tubal ligation for contraception.   Considered the following items based upon USPSTF recommendations: Diabetes screening: ordered Screening for elevated cholesterol: ordered HIV testing: discussed, not ordered Hepatitis C: discussed, not ordered Hepatitis B: discussed, not ordered Syphilis if at high risk: discussed GC/CT not at high risk and not  ordered. Reviewed risk factors for latent tuberculosis and not indicated   Discussed family history, BRCA testing not indicated.  Cervical cancer screening: due for Pap today, cytology + HPV ordered Breast cancer screening:  overdue, mammogram 1 year ago showed suspicious lesion at 10 o'clock position on R breast, patient subsequently had [place] c/w benign intramammary lymph node.  Repeat Mammo ordered today.  Colorectal cancer screening: discussed, colonoscopy ordered previously due to family hx of colon CA. Patient to call GI to reschedule. Discussed necessity of this procedure given strong family hx in brother and mother.   Follow up in 1 year or sooner if indicated.    Pearla Dubonnet, MD [place]

## 2021-09-10 DIAGNOSIS — K5900 Constipation, unspecified: Secondary | ICD-10-CM | POA: Insufficient documentation

## 2021-09-10 LAB — LIPID PANEL
Chol/HDL Ratio: 3.2 ratio (ref 0.0–4.4)
Cholesterol, Total: 273 mg/dL — ABNORMAL HIGH (ref 100–199)
HDL: 86 mg/dL (ref >39)
LDL Chol Calc (NIH): 178 mg/dL — ABNORMAL HIGH (ref 0–99)
Triglycerides: 62 mg/dL (ref 0–149)
VLDL Cholesterol Cal: 9 mg/dL (ref 5–40)

## 2021-09-10 NOTE — Assessment & Plan Note (Signed)
-   Advised to try MiraLAX, start at one capful/day and uptitrate from there

## 2021-09-11 ENCOUNTER — Ambulatory Visit
Admission: RE | Admit: 2021-09-11 | Discharge: 2021-09-11 | Disposition: A | Discharge status: Home / Self Care | Payer: BC Managed Care – PPO | Source: Ambulatory Visit | Attending: Family Medicine | Admitting: Family Medicine

## 2021-09-11 DIAGNOSIS — Z1231 Encounter for screening mammogram for malignant neoplasm of breast: Secondary | ICD-10-CM | POA: Diagnosis not present

## 2021-09-13 LAB — CYTOLOGY - PAP
Adequacy: ABSENT
Comment: NEGATIVE
Diagnosis: UNDETERMINED — AB
High risk HPV: NEGATIVE

## 2022-08-19 ENCOUNTER — Ambulatory Visit (INDEPENDENT_AMBULATORY_CARE_PROVIDER_SITE_OTHER): Payer: BC Managed Care – PPO | Admitting: Student

## 2022-08-19 ENCOUNTER — Other Ambulatory Visit: Payer: Self-pay

## 2022-08-19 ENCOUNTER — Encounter: Payer: Self-pay | Admitting: Student

## 2022-08-19 VITALS — BP 137/82 | HR 70 | Ht 67.0 in | Wt 213.2 lb

## 2022-08-19 DIAGNOSIS — E66811 Obesity, class 1: Secondary | ICD-10-CM

## 2022-08-19 DIAGNOSIS — Z1231 Encounter for screening mammogram for malignant neoplasm of breast: Secondary | ICD-10-CM | POA: Diagnosis not present

## 2022-08-19 DIAGNOSIS — E669 Obesity, unspecified: Secondary | ICD-10-CM

## 2022-08-19 DIAGNOSIS — Z0000 Encounter for general adult medical examination without abnormal findings: Secondary | ICD-10-CM

## 2022-08-19 DIAGNOSIS — Z13220 Encounter for screening for lipoid disorders: Secondary | ICD-10-CM

## 2022-08-19 NOTE — Progress Notes (Signed)
    SUBJECTIVE:   Chief compliant/HPI: annual examination  Jenna Yoder is a 47 y.o. who presents today for an annual exam. Chronic issues include HTN and GERD, strong family hx of colon CA  History tabs reviewed and updated.   Review of systems form reviewed and notable for none.   Works at [employer].   Recent Weight Gain Weight is up 24 lbs over the past two years.  She attributes much of this to doing more eating out and less cooking at home that she has done in the past.  She also finds that she is moving less.  Was previously doing exercises (cardio: treadmill) but not lifting weights.  She tells me that she has a fairly high carbohydrate diet and also avoids red meat, preferring chicken and fish as her protein sources..  OBJECTIVE:   BP 137/82   Pulse 70   Ht 5\' 7"  (1.702 m)   Wt 213 lb 3.2 oz (96.7 kg)   LMP 07/22/2022   SpO2 100%   BMI 33.39 kg/m   General: alert & oriented, no apparent distress, well groomed HEENT: normocephalic, atraumatic, EOM grossly intact, oral mucosa moist, neck supple Respiratory: normal respiratory effort GI: non-distended Skin: no rashes, no jaundice Psych: appropriate mood and affect   ASSESSMENT/PLAN:   Obesity (BMI 30.0-34.9) Desires weight loss.  Will start with some lifestyle interventions including emphasizing protein and fiber in her diet.  Hopeful that the increase in fiber also possible for chronic constipation.  She is going to shoot for 150 g of protein daily and 28-30 g of fiber daily.  We also discussed the importance of resistance training in addition to the treadmill workouts that she has been doing. -Follow-up in 4 weeks -Can consider addition of GLP-1 agonist at follow-up    Annual Examination  See AVS for age appropriate recommendations.   Blood pressure reviewed and at goal.  Asked about intimate partner violence and resources given as appropriate  The patient currently uses tubal ligation for contraception.    Considered the following items based upon USPSTF recommendations: Diabetes screening:  Reviewed from last visit, appropriate Screening for elevated cholesterol: ordered HIV testing: discussed Hepatitis C: discussed Hepatitis B: discussed Syphilis if at high risk: discussed GC/CT not at high risk and not ordered. Reviewed risk factors for latent tuberculosis and not indicated  Cervical cancer screening:  Up-to-date, not due again until 2026 Breast cancer screening:  Ordered, will be due on 09/12/2022, she will schedule for after that date Colorectal cancer screening: discussed, colonoscopy ordered   Follow up in 1 month to check in on weight loss efforts.    Eliezer Mccoy, MD [facility]

## 2022-08-19 NOTE — Patient Instructions (Addendum)
Please call [facility] For a colonoscopy appointment [phone]  I have no doubt that as soon as you start paying attention to your diet, that you'll lose the first 10 lbs or so easily. I recommend downloading the Cronometer app and using this to track your foods.  Let's target 150g protein/day. Protein shakes are okay to help reach this goal.  I ALSO want you to try to get 28g-30g of fiber daily. Avocado, lentils, and beans are the best sources of fiber. Corrie Mckusick is also great.    Here's a guide to how much exercise the body needs. These should be minutes of dedicated moderate-difficulty cardiovascular exercise. Hiking, jogging, swimming, biking, rucking, or pilates are all good options. As you're starting out, a brisk walk is likely enough of a stress on your body, but as you adapt, the exercises should become more challenging.  150 min/week to maintain and improve cardiovascular health 150-250 min/week to prevent weight gain 225-420 min/week (1 hr/day!) to promote meaningful weight loss 200-300 min/week to prevent recurrent weight gain after loss   In addition, resistance exercises such as working out with resistance bands/weights/weight machines or calisthenics is a MUST. These exercises should be done twice weekly at minimum and the weights should get heavier with time.    I have ordered your mammogram. This will be done at the [facility], right across the street from our clinic. Make this appointment for AFTER July 5th.  You will need to call to make this appointment. Their number is [phone].  The address is [address], [place], [place] [address]    Eliezer Mccoy, MD

## 2022-08-20 LAB — LIPID PANEL
Chol/HDL Ratio: 2.7 ratio (ref 0.0–4.4)
Cholesterol, Total: 241 mg/dL — ABNORMAL HIGH (ref 100–199)
HDL: 89 mg/dL (ref >39)
LDL Chol Calc (NIH): 141 mg/dL — ABNORMAL HIGH (ref 0–99)
Triglycerides: 66 mg/dL (ref 0–149)
VLDL Cholesterol Cal: 11 mg/dL (ref 5–40)

## 2022-08-20 NOTE — Assessment & Plan Note (Addendum)
Desires weight loss.  Will start with some lifestyle interventions including emphasizing protein and fiber in her diet.  Hopeful that the increase in fiber also possible for chronic constipation.  She is going to shoot for 150 g of protein daily and 28-30 g of fiber daily.  We also discussed the importance of resistance training in addition to the treadmill workouts that she has been doing. -Follow-up in 4 weeks -Can consider addition of GLP-1 agonist at follow-up

## 2022-09-09 ENCOUNTER — Encounter: Payer: Self-pay | Admitting: Internal Medicine

## 2022-09-23 ENCOUNTER — Other Ambulatory Visit: Payer: Self-pay

## 2022-09-23 ENCOUNTER — Ambulatory Visit: Payer: BC Managed Care – PPO | Admitting: Student

## 2022-09-23 ENCOUNTER — Encounter: Payer: Self-pay | Admitting: Student

## 2022-09-23 VITALS — BP 128/78 | HR 67 | Wt 211.2 lb

## 2022-09-23 DIAGNOSIS — L308 Other specified dermatitis: Secondary | ICD-10-CM | POA: Diagnosis not present

## 2022-09-23 DIAGNOSIS — E669 Obesity, unspecified: Secondary | ICD-10-CM | POA: Diagnosis not present

## 2022-09-23 DIAGNOSIS — E785 Hyperlipidemia, unspecified: Secondary | ICD-10-CM

## 2022-09-23 DIAGNOSIS — Z800 Family history of malignant neoplasm of digestive organs: Secondary | ICD-10-CM | POA: Diagnosis not present

## 2022-09-23 DIAGNOSIS — E66811 Obesity, class 1: Secondary | ICD-10-CM

## 2022-09-23 MED ORDER — FLUTICASONE PROPIONATE 0.05 % EX LOTN: 1.0000 "application " | TOPICAL_LOTION | Freq: Every day | CUTANEOUS | 1 refills | Status: DC

## 2022-09-23 NOTE — Patient Instructions (Addendum)
Fiber will help with lowering your LDL, oatmeal is a great option here. Avocados are a great option here. I'm so glad the protein-focused diet is working for you. Keep up the good work. The "next best step" is resistance training.. The machines at [employer] are a great option.   Thanks for getting your colonoscopy and mammogram scheduled!   Eliezer Mccoy, MD

## 2022-09-23 NOTE — Assessment & Plan Note (Signed)
Defers statin initiation today.  This is certainly reasonable choice given that she has already seen 30 point improvement in her LDL with weight loss and exercise efforts.  Hope to continue this trend.

## 2022-09-23 NOTE — Assessment & Plan Note (Signed)
Weight is down 2-1/2 pounds since her last visit.  She attributes this to diet changes.  I think with the addition of some resistance training and ongoing focus on protein and fiber in her diet that she will continue to see success.  Advised to follow-up with me anywhere from 1 to 3 months from now.  I suspect that the more often she comes in, the easier time we will have maintaining momentum. -Continue to emphasize protein and fiber in her diet -Introduce resistance training to her exercise regimen

## 2022-09-23 NOTE — Progress Notes (Signed)
    SUBJECTIVE:   CHIEF COMPLAINT / HPI:   Weight Loss Efforts.  Goals at our last visit were to target 150g protein dialy and 38-30g fiber. And also to add resistance training to treadmill workouts.   She has succeeded in losing 2 and half pounds since her last visit!  She attributes this to not eating out as much. And likes the higher protein diet, this suits her eating style well. Has not been tracking protein or fiber but has been emphasizing that both in her diet.  She eats oatmeal and avocado for breakfast every day to give herself a fiber bolus. Unfortunately, has not been in the gym as they've been remodeling. But plans to get back in the gym once the remodeling finishes.  Feels that using a muscle focused workout plan would work well for her.  Health Maintenance Mammogram AND colonoscopy scheduled for 7/25! Discussed potential of statin therapy today given LDL elevation at 141 mg/dL.  She has a 10-year risk of 0.9% per the PREVENT Calculator, 6.6% 30 year risk.  She prefers to avoid medication therapy at this time.  Would like to emphasize weight loss and lifestyle interventions.  This is certainly a reasonable approach.   OBJECTIVE:   BP 128/78   Pulse 67   Wt 211 lb 3.2 oz (95.8 kg)   SpO2 100%   BMI 33.08 kg/m   General: alert & oriented, no apparent distress, well groomed HEENT: normocephalic, atraumatic, EOM grossly intact, oral mucosa moist, neck supple Respiratory: normal respiratory effort GI: non-distended Skin: no rashes, no jaundice Psych: appropriate mood and affect   ASSESSMENT/PLAN:   Hyperlipidemia Defers statin initiation today.  This is certainly reasonable choice given that she has already seen 30 point improvement in her LDL with weight loss and exercise efforts.  Hope to continue this trend.  Obesity (BMI 30.0-34.9) Weight is down 2-1/2 pounds since her last visit.  She attributes this to diet changes.  I think with the addition of some resistance  training and ongoing focus on protein and fiber in her diet that she will continue to see success.  Advised to follow-up with me anywhere from 1 to 3 months from now.  I suspect that the more often she comes in, the easier time we will have maintaining momentum. -Continue to emphasize protein and fiber in her diet -Introduce resistance training to her exercise regimen  FHx: colon cancer Has her colonoscopy scheduled!   Eliezer Mccoy, MD [facility]

## 2022-09-23 NOTE — Assessment & Plan Note (Signed)
Has her colonoscopy scheduled!

## 2022-09-25 ENCOUNTER — Encounter: Payer: Self-pay | Admitting: Podiatry

## 2022-09-25 ENCOUNTER — Ambulatory Visit: Payer: BC Managed Care – PPO | Admitting: Podiatry

## 2022-09-25 DIAGNOSIS — M7752 Other enthesopathy of left foot: Secondary | ICD-10-CM | POA: Diagnosis not present

## 2022-09-25 DIAGNOSIS — Q688 Other specified congenital musculoskeletal deformities: Secondary | ICD-10-CM

## 2022-09-25 MED ORDER — TRIAMCINOLONE ACETONIDE 40 MG/ML IJ SUSP
20.0000 mg | Freq: Once | INTRAMUSCULAR | Status: AC
  Administered 2022-09-25: 20 mg via INTRAMUSCULAR

## 2022-09-25 NOTE — Progress Notes (Signed)
She presents today states that the pain is come back down most recently in the left ankle but it has been over a year that it was doing much better.  States that she is working at [employer] and doing a lot of walking.  She states that she has not taken anything for the pain and the pain gets worse after she has been walking for a while.  Objective: Vital signs are stable alert and oriented x 3.  Pulses are palpable.  She still has pain on sharp plantarflexion of the ankle joint impinging the os trigonum.  S she also has tenderness on dorsiflexion of the hallux and the posterior ankle.  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  Plan: I injected the posterior ankle today from a lateral approach sterile Betadine skin prep with 10 mg Kenalog 5 mg Marcaine patient stated that we were right on the spot.  And I will follow-up with her on an as-needed basis.

## 2022-10-02 ENCOUNTER — Ambulatory Visit (AMBULATORY_SURGERY_CENTER): Payer: BC Managed Care – PPO

## 2022-10-02 ENCOUNTER — Ambulatory Visit: Payer: BC Managed Care – PPO

## 2022-10-02 VITALS — Ht 67.0 in | Wt 211.0 lb

## 2022-10-02 DIAGNOSIS — Z800 Family history of malignant neoplasm of digestive organs: Secondary | ICD-10-CM

## 2022-10-02 DIAGNOSIS — Z1211 Encounter for screening for malignant neoplasm of colon: Secondary | ICD-10-CM

## 2022-10-02 MED ORDER — NA SULFATE-K SULFATE-MG SULF 17.5-3.13-1.6 GM/177ML PO SOLN: 1.0000 | Freq: Once | ORAL | 0 refills | Status: AC

## 2022-10-02 NOTE — Progress Notes (Signed)

## 2022-10-13 ENCOUNTER — Encounter: Payer: Self-pay | Admitting: Internal Medicine

## 2022-10-22 ENCOUNTER — Ambulatory Visit: Admission: RE | Admit: 2022-10-22 | Payer: BC Managed Care – PPO | Source: Ambulatory Visit

## 2022-10-22 DIAGNOSIS — Z1231 Encounter for screening mammogram for malignant neoplasm of breast: Secondary | ICD-10-CM

## 2022-10-23 ENCOUNTER — Encounter: Payer: Self-pay | Admitting: Internal Medicine

## 2022-10-23 ENCOUNTER — Ambulatory Visit (AMBULATORY_SURGERY_CENTER): Payer: BC Managed Care – PPO | Admitting: Internal Medicine

## 2022-10-23 VITALS — BP 140/85 | HR 71 | Temp 98.3°F | Resp 21 | Ht 67.0 in | Wt 211.0 lb

## 2022-10-23 DIAGNOSIS — Z1211 Encounter for screening for malignant neoplasm of colon: Secondary | ICD-10-CM | POA: Diagnosis not present

## 2022-10-23 DIAGNOSIS — D125 Benign neoplasm of sigmoid colon: Secondary | ICD-10-CM | POA: Diagnosis not present

## 2022-10-23 DIAGNOSIS — Z800 Family history of malignant neoplasm of digestive organs: Secondary | ICD-10-CM

## 2022-10-23 MED ORDER — SODIUM CHLORIDE 0.9 % IV SOLN: 500.0000 mL | Freq: Once | INTRAVENOUS | Status: DC

## 2022-10-23 NOTE — Progress Notes (Signed)
Sedate, gd SR, tolerated procedure well, VSS, report to RN 

## 2022-10-23 NOTE — Progress Notes (Signed)
HISTORY OF PRESENT ILLNESS:  Jenna Yoder is a 47 y.o. female who presents today for screening colonoscopy.  Mother with colon cancer in her 72s.  REVIEW OF SYSTEMS:  All non-GI ROS negative except for  Past Medical History:  Diagnosis Date   Anemia 11/13/2015   Dentalgia    Family history of colon cancer    Family history of prostate cancer    Family history of throat cancer    HTN (hypertension)    Hyperlipidemia    UTI (lower urinary tract infection)     Past Surgical History:  Procedure Laterality Date   CESAREAN SECTION     TUBAL LIGATION      Social History Jenna Yoder  reports that she has never smoked. She has never been exposed to tobacco smoke. She has never used smokeless tobacco. She reports current alcohol use. She reports that she does not use drugs.  family history includes Colon cancer in her mother; Colon cancer (age of onset: 31) in her brother; Diabetes in her mother and sister; Heart attack (age of onset: 85) in her mother; Hypertension in her brother and mother; Prostate cancer in her father; Throat cancer in her maternal grandfather.  No Known Allergies     PHYSICAL EXAMINATION: Vital signs: BP 123/74   Pulse 66   Temp 98.3 F (36.8 C)   Ht 5\' 7"  (1.702 m)   Wt 211 lb (95.7 kg)   LMP 09/26/2022   SpO2 98%   BMI 33.05 kg/m  General: Well-developed, well-nourished, no acute distress HEENT: Sclerae are anicteric, conjunctiva pink. Oral mucosa intact Lungs: Clear Heart: Regular Abdomen: soft, nontender, nondistended, no obvious ascites, no peritoneal signs, normal bowel sounds. No organomegaly. Extremities: No edema Psychiatric: alert and oriented x3. Cooperative     ASSESSMENT:   Family history of colon cancer  PLAN:  Colon cancer screening

## 2022-10-23 NOTE — Op Note (Signed)
[facility] Patient Name: Frankye Falla Procedure Date: 10/23/2022 12:00 PM MRN: [id number] Endoscopist: Wilhemina Bonito. Marina Goodell , MD, [id number] Age: 47 Referring MD:  Date of Birth: 1975/10/12 Gender: Female Account #: [id number] Procedure:                Colonoscopy with cold snare polypectomy x 1 Indications:              Screening in patient at increased risk: Colorectal                            cancer in mother before age 81 Medicines:                Monitored Anesthesia Care Procedure:                Pre-Anesthesia Assessment:                           - Prior to the procedure, a History and Physical                            was performed, and patient medications and                            allergies were reviewed. The patient's tolerance of                            previous anesthesia was also reviewed. The risks                            and benefits of the procedure and the sedation                            options and risks were discussed with the patient.                            All questions were answered, and informed consent                            was obtained. Prior Anticoagulants: The patient has                            taken no anticoagulant or antiplatelet agents. ASA                            Grade Assessment: II - A patient with mild systemic                            disease. After reviewing the risks and benefits,                            the patient was deemed in satisfactory condition to                            undergo the procedure.  After obtaining informed consent, the colonoscope                            was passed under direct vision. Throughout the                            procedure, the patient's blood pressure, pulse, and                            oxygen saturations were monitored continuously. The                            Olympus CF-HQ190L [id number]) Colonoscope was                             introduced through the anus and advanced to the the                            cecum, identified by appendiceal orifice and                            ileocecal valve. The ileocecal valve, appendiceal                            orifice, and rectum were photographed. The quality                            of the bowel preparation was excellent. The                            colonoscopy was performed without difficulty. The                            patient tolerated the procedure well. The bowel                            preparation used was SUPREP via split dose                            instruction. Scope In: 12:09:04 PM Scope Out: 12:26:26 PM Scope Withdrawal Time: 0 hours 12 minutes 8 seconds  Total Procedure Duration: 0 hours 17 minutes 22 seconds  Findings:                 A 5 mm polyp was found in the sigmoid colon. The                            polyp was removed with a cold snare. Resection and                            retrieval were complete.                           The exam was otherwise without abnormality on  direct and retroflexion views. Complications:            No immediate complications. Estimated blood loss:                            None. Estimated Blood Loss:     Estimated blood loss: none. Impression:               - One 5 mm polyp in the sigmoid colon, removed with                            a cold snare. Resected and retrieved.                           - The examination was otherwise normal on direct                            and retroflexion views. Recommendation:           - Repeat colonoscopy in 5 years for surveillance.                           - Patient has a contact number available for                            emergencies. The signs and symptoms of potential                            delayed complications were discussed with the                            patient. Return to normal activities tomorrow.                             Written discharge instructions were provided to the                            patient.                           - Resume previous diet.                           - Continue present medications.                           - Await pathology results. Wilhemina Bonito. Marina Goodell, MD 10/23/2022 12:31:04 PM This report has been signed electronically.

## 2022-10-23 NOTE — Progress Notes (Signed)
Called to room to assist during endoscopic procedure.  Patient ID and intended procedure confirmed with present staff. Received instructions for my participation in the procedure from the performing physician.  

## 2022-10-23 NOTE — Patient Instructions (Signed)
Handout provided about polyps.  Resume previous diet.  Continue present medications.  Await pathology results.  Repeat colonoscopy in 5 years for surveillance.   YOU HAD AN ENDOSCOPIC PROCEDURE TODAY AT THE  ENDOSCOPY CENTER:   Refer to the procedure report that was given to you for any specific questions about what was found during the examination.  If the procedure report does not answer your questions, please call your gastroenterologist to clarify.  If you requested that your care partner not be given the details of your procedure findings, then the procedure report has been included in a sealed envelope for you to review at your convenience later.  YOU SHOULD EXPECT: Some feelings of bloating in the abdomen. Passage of more gas than usual.  Walking can help get rid of the air that was put into your GI tract during the procedure and reduce the bloating. If you had a lower endoscopy (such as a colonoscopy or flexible sigmoidoscopy) you may notice spotting of blood in your stool or on the toilet paper. If you underwent a bowel prep for your procedure, you may not have a normal bowel movement for a few days.  Please Note:  You might notice some irritation and congestion in your nose or some drainage.  This is from the oxygen used during your procedure.  There is no need for concern and it should clear up in a day or so.  SYMPTOMS TO REPORT IMMEDIATELY:  Following lower endoscopy (colonoscopy or flexible sigmoidoscopy):  Excessive amounts of blood in the stool  Significant tenderness or worsening of abdominal pains  Swelling of the abdomen that is new, acute  Fever of 100F or higher  For urgent or emergent issues, a gastroenterologist can be reached at any hour by calling [phone]. Do not use MyChart messaging for urgent concerns.    DIET:  We do recommend a small meal at first, but then you may proceed to your regular diet.  Drink plenty of fluids but you should avoid alcoholic  beverages for 24 hours.  ACTIVITY:  You should plan to take it easy for the rest of today and you should NOT DRIVE or use heavy machinery until tomorrow (because of the sedation medicines used during the test).    FOLLOW UP: Our staff will call the number listed on your records the next business day following your procedure.  We will call around 7:15- 8:00 am to check on you and address any questions or concerns that you may have regarding the information given to you following your procedure. If we do not reach you, we will leave a message.     If any biopsies were taken you will be contacted by phone or by letter within the next 1-3 weeks.  Please call us at [phone] if you have not heard about the biopsies in 3 weeks.    SIGNATURES/CONFIDENTIALITY: You and/or your care partner have signed paperwork which will be entered into your electronic medical record.  These signatures attest to the fact that that the information above on your After Visit Summary has been reviewed and is understood.  Full responsibility of the confidentiality of this discharge information lies with you and/or your care-partner.

## 2022-10-24 ENCOUNTER — Telehealth: Payer: Self-pay

## 2022-10-24 NOTE — Telephone Encounter (Signed)
Attempted to reach patient for post-procedure f/u call. No answer. Left message for her to please not hesitate to call if she has any questions/concerns regarding her care. 

## 2022-10-29 ENCOUNTER — Encounter: Payer: Self-pay | Admitting: Internal Medicine

## 2023-08-06 ENCOUNTER — Encounter: Payer: Self-pay | Admitting: Podiatry

## 2023-08-06 ENCOUNTER — Ambulatory Visit: Admitting: Podiatry

## 2023-08-06 DIAGNOSIS — M7752 Other enthesopathy of left foot: Secondary | ICD-10-CM | POA: Diagnosis not present

## 2023-08-06 MED ORDER — TRIAMCINOLONE ACETONIDE 40 MG/ML IJ SUSP
20.0000 mg | Freq: Once | INTRAMUSCULAR | Status: AC
  Administered 2023-08-06: 20 mg

## 2023-08-06 NOTE — Progress Notes (Signed)
 She presents today for follow-up of her subtalar joint capsulitis left ankle.  Objective: Vital signs stable alert oriented x 3.  Pulses are palpable.  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  Plan: Injected the posterior aspect near the subtalar joint ankle joint today.  I injected this with 20 mg Kenalog  5 mg Marcaine to the point maximal tenderness.  Follow-up with her in a few months or as needed

## 2023-08-18 ENCOUNTER — Encounter: Payer: Self-pay | Admitting: *Deleted

## 2023-09-25 ENCOUNTER — Other Ambulatory Visit: Payer: Self-pay

## 2023-09-25 DIAGNOSIS — L308 Other specified dermatitis: Secondary | ICD-10-CM

## 2023-09-25 MED ORDER — FLUTICASONE PROPIONATE 0.05 % EX LOTN: 1.0000 "application " | TOPICAL_LOTION | Freq: Every day | CUTANEOUS | 1 refills | Status: AC

## 2023-11-03 ENCOUNTER — Encounter: Payer: Self-pay | Admitting: Family Medicine

## 2023-11-03 ENCOUNTER — Ambulatory Visit (INDEPENDENT_AMBULATORY_CARE_PROVIDER_SITE_OTHER): Admitting: Family Medicine

## 2023-11-03 VITALS — BP 133/88 | HR 73 | Ht 67.0 in | Wt 217.8 lb

## 2023-11-03 DIAGNOSIS — Z0000 Encounter for general adult medical examination without abnormal findings: Secondary | ICD-10-CM

## 2023-11-03 DIAGNOSIS — E785 Hyperlipidemia, unspecified: Secondary | ICD-10-CM | POA: Diagnosis not present

## 2023-11-03 DIAGNOSIS — I10 Essential (primary) hypertension: Secondary | ICD-10-CM

## 2023-11-03 NOTE — Progress Notes (Signed)
    SUBJECTIVE:   Chief compliant/HPI: annual examination  Jenna Yoder is a 48 y.o. who presents today for an annual exam.   History tabs reviewed and updated.   Review of systems form reviewed and notable for intermittent stomach pain, pt has history of fibroids. Denies bleeding, black or tarry stools, fever, chills.  OBJECTIVE:   BP 133/88   Pulse 73   Ht 5' 7 (1.702 m)   Wt 217 lb 12.8 oz (98.8 kg)   LMP 10/24/2023   SpO2 98%   BMI 34.11 kg/m   General: A&O, NAD HEENT: No sign of trauma, EOM grossly intact Cardiac: RRR, no m/r/g Respiratory: CTAB, normal WOB, no w/c/r GI: Soft, NTTP, non-distended  Extremities: NTTP, no peripheral edema.  ASSESSMENT/PLAN:   Assessment & Plan Essential hypertension -bp at goal today, continued to encourage diet and exercise interventions -bmet for routine monitoring of electrolytes and kidney function Hyperlipidemia, unspecified hyperlipidemia type -lipid panel today  Annual physical exam See below Annual Examination  See AVS for age appropriate recommendations.   PHQ score 0, reviewed and discussed.  Blood pressure reviewed and at goal.  Asked about intimate partner violence and resources given as appropriate  Patient had bilateral tubal ligation, no further contraception management needed.   Considered the following items based upon USPSTF recommendations: Diabetes screening: discussed Screening for elevated cholesterol: ordered HIV testing: complete Hepatitis C: complete Hepatitis B: complete Syphilis if at high risk: not high risk GC/CT not at high risk and not ordered. Reviewed risk factors for latent tuberculosis and not indicated  Cervical cancer screening: prior Pap reviewed, repeat due in 2028 Breast cancer screening: discussed potential benefits, risks including overdiagnosis and biopsy, elected proceed with mammogram Colorectal cancer screening: up to date on screening for CRC. if age 81 or over.    Follow up in 1 year or sooner if indicated.  MyChart Activation: Already signed up  Lucie Pinal, DO [facility]

## 2023-11-03 NOTE — Patient Instructions (Addendum)
 It was wonderful to see you today!  Today was your annual physical exam.  We will do some routine blood work to check your electrolytes, kidney function and a lipid panel.  If any of these levels come back abnormal you will get a phone call from me to discuss.  We also talked about this ongoing stomach pain you have been having.  Because of your history of fibroids I would like to see you back for a dedicated visit to further discuss these problems.    Your annual mammogram has been ordered, you will go to the [facility] breast center to have this completed.  You will receive a handout with instructions on how to schedule this appointment at the end of this packet.  Please call [phone] with any questions about today's appointment.   If you need any additional refills, please call your pharmacy before calling the office.  Lucie Pinal, DO Family Medicine

## 2023-11-03 NOTE — Assessment & Plan Note (Signed)
-   lipid panel today

## 2023-11-03 NOTE — Assessment & Plan Note (Signed)
-  bp at goal today, continued to encourage diet and exercise interventions -bmet for routine monitoring of electrolytes and kidney function

## 2023-11-04 ENCOUNTER — Ambulatory Visit: Payer: Self-pay | Admitting: Family Medicine

## 2023-11-04 LAB — BASIC METABOLIC PANEL WITH GFR
BUN/Creatinine Ratio: 20 (ref 9–23)
BUN: 13 mg/dL (ref 6–24)
CO2: 24 mmol/L (ref 20–29)
Calcium: 9.6 mg/dL (ref 8.7–10.2)
Chloride: 99 mmol/L (ref 96–106)
Creatinine, Ser: 0.64 mg/dL (ref 0.57–1.00)
Glucose: 63 mg/dL — ABNORMAL LOW (ref 70–99)
Potassium: 4.1 mmol/L (ref 3.5–5.2)
Sodium: 138 mmol/L (ref 134–144)
eGFR: 110 mL/min/1.73 (ref >59)

## 2023-11-04 LAB — LIPID PANEL
Chol/HDL Ratio: 3 ratio (ref 0.0–4.4)
Cholesterol, Total: 265 mg/dL — ABNORMAL HIGH (ref 100–199)
HDL: 88 mg/dL (ref >39)
LDL Chol Calc (NIH): 169 mg/dL — ABNORMAL HIGH (ref 0–99)
Triglycerides: 52 mg/dL (ref 0–149)
VLDL Cholesterol Cal: 8 mg/dL (ref 5–40)

## 2023-11-18 ENCOUNTER — Ambulatory Visit
Admission: RE | Admit: 2023-11-18 | Discharge: 2023-11-18 | Disposition: A | Discharge status: Home / Self Care | Source: Ambulatory Visit | Attending: Family Medicine | Admitting: Family Medicine

## 2023-11-18 DIAGNOSIS — Z0000 Encounter for general adult medical examination without abnormal findings: Secondary | ICD-10-CM

## 2023-11-20 ENCOUNTER — Ambulatory Visit (INDEPENDENT_AMBULATORY_CARE_PROVIDER_SITE_OTHER): Payer: Self-pay | Admitting: Family Medicine

## 2023-11-20 ENCOUNTER — Encounter: Payer: Self-pay | Admitting: Family Medicine

## 2023-11-20 VITALS — BP 124/83 | HR 78 | Ht 67.0 in | Wt 224.2 lb

## 2023-11-20 DIAGNOSIS — N920 Excessive and frequent menstruation with regular cycle: Secondary | ICD-10-CM

## 2023-11-20 DIAGNOSIS — R1030 Lower abdominal pain, unspecified: Secondary | ICD-10-CM

## 2023-11-20 NOTE — Patient Instructions (Signed)
 It was wonderful to see you today!  Today we discussed your right lower abdominal pain.  The most likely explanation based on your symptoms is a fibroid, however there are many other organs in that region which could be causing your pain.  We will start by getting an ultrasound if you have any fibroids right now.  If you do I will place a referral for you to go and see gynecology for further management.  If there are no fibroids on the ultrasound, I will have you come back and see me and we will do some further workup to try and figure out where this pain is coming from.  Because you have been having heavier periods along with this intermittent pain, we will also check to make sure you are not anemic.  When your blood work comes back I will give you a phone call if there is anything abnormal and to discuss next steps.  If your blood work results are normal, they will be available to you through MyChart once they are resulted.  Please call [phone] with any questions about today's appointment.   If you need any additional refills, please call your pharmacy before calling the office.  Lucie Pinal, DO Family Medicine

## 2023-11-20 NOTE — Progress Notes (Signed)
    SUBJECTIVE:   CHIEF COMPLAINT / HPI:   Follow up for fibroids/abdominal pain  Has been having about 6 months of intermittent lower right quadrant abdominal pain.  She reports that there is no particular timing that she has noticed it does not correlate to any particular phase of her menstrual cycle but she has noticed that on months where she has this pain her periods are heavier and more painful, and on months where she does not have the pain her periods are less heavy and less painful.  She is not on any hormonal birth controls and uses tubal ligation as her primary pregnancy prevention.  She denies any nausea, vomiting, constipation, diarrhea, bloody stools, fever or chills associated with this pain.  She does report that the pain is incapacitating when it occurs, and it can last anywhere from 30 seconds to 5 minutes at a time.  He has had a history of fibroids in the past with similar pain patterns but at that time occurred in a more predictable pattern along with her menstrual cycle.  Denies intramenstrual bleeding  PERTINENT  PMH / PSH: Fibroids, bilateral tubal ligation  OBJECTIVE:   BP 124/83   Pulse 78   Ht 5' 7 (1.702 m)   Wt 224 lb 3.2 oz (101.7 kg)   LMP 10/24/2023   SpO2 100%   BMI 35.11 kg/m   General: Well-appearing, no distress Cardiac: RRR, no M/R/G Abdominal: Flat, soft.  Tender to palpation over the right lower and central quadrants.  There is a palpable, firm area overlying the uterus at midline which could be consistent with a fibroid.  ASSESSMENT/PLAN:   Assessment & Plan Lower abdominal pain - Discussed different organ systems which could be contributing to her pain, including her reproductive, GI and urinary systems. -Will first obtain ultrasound to rule out or rule in fibroids as a cause for her pain -Plan to either refer to GYN if fibroids or other GYN cause located on imaging Menorrhagia with regular cycle - Obtain CBC and iron panel today to assess  for anemia -Could consider hormonal supplementation to regulate bleeding if patient desires, but will wait for results of transvaginal ultrasound   Jenna Pinal, DO [facility]

## 2023-11-21 LAB — CBC WITH DIFFERENTIAL/PLATELET
Basophils Absolute: 0.1 x10E3/uL (ref 0.0–0.2)
Basos: 1 %
EOS (ABSOLUTE): 0.1 x10E3/uL (ref 0.0–0.4)
Eos: 1 %
Hematocrit: 32.8 % — ABNORMAL LOW (ref 34.0–46.6)
Hemoglobin: 9.2 g/dL — ABNORMAL LOW (ref 11.1–15.9)
Immature Grans (Abs): 0 x10E3/uL (ref 0.0–0.1)
Immature Granulocytes: 0 %
Lymphocytes Absolute: 2.1 x10E3/uL (ref 0.7–3.1)
Lymphs: 35 %
MCH: 20.9 pg — ABNORMAL LOW (ref 26.6–33.0)
MCHC: 28 g/dL — ABNORMAL LOW (ref 31.5–35.7)
MCV: 75 fL — ABNORMAL LOW (ref 79–97)
Monocytes Absolute: 0.5 x10E3/uL (ref 0.1–0.9)
Monocytes: 9 %
Neutrophils Absolute: 3.1 x10E3/uL (ref 1.4–7.0)
Neutrophils: 54 %
Platelets: 286 x10E3/uL (ref 150–450)
RBC: 4.4 x10E6/uL (ref 3.77–5.28)
RDW: 17.1 % — ABNORMAL HIGH (ref 11.7–15.4)
WBC: 5.9 x10E3/uL (ref 3.4–10.8)

## 2023-11-21 LAB — IRON,TIBC AND FERRITIN PANEL
Ferritin: 6 ng/mL — ABNORMAL LOW (ref 15–150)
Iron Saturation: 5 % — CL (ref 15–55)
Iron: 21 ug/dL — ABNORMAL LOW (ref 27–159)
Total Iron Binding Capacity: 452 ug/dL — ABNORMAL HIGH (ref 250–450)
UIBC: 431 ug/dL — ABNORMAL HIGH (ref 131–425)

## 2023-11-24 ENCOUNTER — Ambulatory Visit
Admission: RE | Admit: 2023-11-24 | Discharge: 2023-11-24 | Disposition: A | Discharge status: Home / Self Care | Source: Ambulatory Visit | Attending: Family Medicine | Admitting: Family Medicine

## 2023-11-24 DIAGNOSIS — R1030 Lower abdominal pain, unspecified: Secondary | ICD-10-CM

## 2023-11-25 ENCOUNTER — Ambulatory Visit: Payer: Self-pay | Admitting: Family Medicine

## 2023-11-25 DIAGNOSIS — N920 Excessive and frequent menstruation with regular cycle: Secondary | ICD-10-CM

## 2023-11-25 MED ORDER — IRON (FERROUS SULFATE) 325 (65 FE) MG PO TABS: 1.0000 | ORAL_TABLET | ORAL | 3 refills | Status: DC

## 2023-11-25 NOTE — Telephone Encounter (Signed)
 Called patient to discuss the results from her recent office visit. Patient confirmed her name and date of birth prior to discussing results.  Patient has significant iron  deficiency anemia, secondary to her previously discussed menorrhagia. I sent in a prescription for iron  supplements to be taken every other day. Discussed probable side effects, including constipation and dark stools. Jabria voiced understanding and agreement.

## 2023-11-30 NOTE — Addendum Note (Signed)
 Addended by: Candi Profit on: 11/30/2023 11:20 AM   Modules accepted: Orders

## 2024-01-21 ENCOUNTER — Other Ambulatory Visit (HOSPITAL_COMMUNITY)
Admission: RE | Admit: 2024-01-21 | Discharge: 2024-01-21 | Disposition: A | Source: Ambulatory Visit | Attending: Obstetrics | Admitting: Obstetrics

## 2024-01-21 ENCOUNTER — Encounter: Payer: Self-pay | Admitting: Obstetrics

## 2024-01-21 ENCOUNTER — Ambulatory Visit: Admitting: Obstetrics

## 2024-01-21 VITALS — BP 136/84 | HR 68 | Ht 67.0 in | Wt 224.0 lb

## 2024-01-21 DIAGNOSIS — Z01419 Encounter for gynecological examination (general) (routine) without abnormal findings: Secondary | ICD-10-CM | POA: Diagnosis present

## 2024-01-21 DIAGNOSIS — E669 Obesity, unspecified: Secondary | ICD-10-CM

## 2024-01-21 DIAGNOSIS — D251 Intramural leiomyoma of uterus: Secondary | ICD-10-CM

## 2024-01-21 DIAGNOSIS — R102 Pelvic and perineal pain unspecified side: Secondary | ICD-10-CM | POA: Diagnosis not present

## 2024-01-21 DIAGNOSIS — D500 Iron deficiency anemia secondary to blood loss (chronic): Secondary | ICD-10-CM | POA: Diagnosis not present

## 2024-01-21 MED ORDER — ACCRUFER 30 MG PO CAPS: 1.0000 | ORAL_CAPSULE | Freq: Two times a day (BID) | ORAL | 3 refills | Status: AC

## 2024-01-21 MED ORDER — IBUPROFEN 800 MG PO TABS: 800.0000 mg | ORAL_TABLET | Freq: Three times a day (TID) | ORAL | 5 refills | Status: AC | PRN

## 2024-01-21 MED ORDER — PNV TABS 29-1 29-1 MG PO TABS: 1.0000 | ORAL_TABLET | Freq: Every day | ORAL | 3 refills | Status: DC

## 2024-01-21 NOTE — Progress Notes (Signed)
 Subjective:        Jenna Yoder is a 48 y.o. female here for a routine exam.  Current complaints: Pelvic pain and heavy, painful periods.  History of uterine fibroids..    Personal health questionnaire:  Is patient Ashkenazi Jewish, have a family history of breast and/or ovarian cancer: no Is there a family history of uterine cancer diagnosed at age < 42, gastrointestinal cancer, urinary tract cancer, family member who is a Personnel Officer syndrome-associated carrier: yes Is the patient overweight and hypertensive, family history of diabetes, personal history of gestational diabetes, preeclampsia or PCOS: no Is patient over 54, have PCOS,  family history of premature CHD under age 22, diabetes, smoke, have hypertension or peripheral artery disease:  no At any time, has a partner hit, kicked or otherwise hurt or frightened you?: no Over the past 2 weeks, have you felt down, depressed or hopeless?: no Over the past 2 weeks, have you felt little interest or pleasure in doing things?:no   Gynecologic History Patient's last menstrual period was 12/24/2023 (exact date). Contraception: tubal ligation Last Pap: 2023. Results were: ASCUS with negative High Risk HPV Last mammogram: 11-18-2023. Results were: normal  Obstetric History OB History  No obstetric history on file.    Past Medical History:  Diagnosis Date   Anemia 11/13/2015   Dentalgia    Family history of colon cancer    Family history of prostate cancer    Family history of throat cancer    HTN (hypertension)    Hyperlipidemia    UTI (lower urinary tract infection)     Past Surgical History:  Procedure Laterality Date   CESAREAN SECTION     TUBAL LIGATION       Current Outpatient Medications:    Ferric Maltol (ACCRUFER) 30 MG CAPS, Take 1 capsule (30 mg total) by mouth 2 (two) times daily before a meal. Take 2 hrs before, or 2 hrs after a meal., Disp: 60 capsule, Rfl: 3   Fluticasone  Propionate 0.05 % LOTN, Apply 1  application  topically daily., Disp: 60 mL, Rfl: 1   ibuprofen  (ADVIL ) 800 MG tablet, Take 1 tablet (800 mg total) by mouth every 8 (eight) hours as needed., Disp: 30 tablet, Rfl: 5   Prenatal Vit-Iron  Carbonyl-FA (PNV TABS 29-1) 29-1 MG TABS, Take 1 tablet by mouth daily before breakfast., Disp: 90 tablet, Rfl: 3   polyethylene glycol powder (GLYCOLAX /MIRALAX ) 17 GM/SCOOP powder, Take 17 g by mouth daily. (Patient not taking: Reported on 01/21/2024), Disp: 500 g, Rfl: 0 No Known Allergies  Social History   Tobacco Use   Smoking status: Never    Passive exposure: Never   Smokeless tobacco: Never  Substance Use Topics   Alcohol use: Yes    Comment: occ    Family History  Problem Relation Age of Onset   Diabetes Mother    Hypertension Mother    Colon cancer Mother        dx 41s   Heart attack Mother 73   Prostate cancer Father        dx 73s   Diabetes Sister    Throat cancer Maternal Grandfather        dx ?   Hypertension Brother    Colon cancer Brother 40   Stomach cancer Neg Hx    Liver disease Neg Hx    Pancreatic cancer Neg Hx    Esophageal cancer Neg Hx    Colon polyps Neg Hx    Rectal cancer  Neg Hx    Breast cancer Neg Hx       Review of Systems  Constitutional: negative for fatigue and weight loss Respiratory: negative for cough and wheezing Cardiovascular: negative for chest pain, fatigue and palpitations Gastrointestinal: negative for abdominal pain and change in bowel habits Musculoskeletal:negative for myalgias Neurological: negative for gait problems and tremors Behavioral/Psych: negative for abusive relationship, depression Endocrine: negative for temperature intolerance    Genitourinary:negative for abnormal menstrual periods, genital lesions, hot flashes, sexual problems and vaginal discharge Integument/breast: negative for breast lump, breast tenderness, nipple discharge and skin lesion(s)    Objective:       BP 136/84   Pulse 68   Ht 5' 7  (1.702 m)   Wt 224 lb (101.6 kg)   LMP 12/24/2023 (Exact Date)   BMI 35.08 kg/m  General:   alert  Skin:   no rash or abnormalities  Lungs:   clear to auscultation bilaterally  Heart:   regular rate and rhythm, S1, S2 normal, no murmur, click, rub or gallop  Breasts:   normal without suspicious masses, skin or nipple changes or axillary nodes  Abdomen:  normal findings: no organomegaly, soft, non-tender and no hernia  Pelvis:  External genitalia: normal general appearance Urinary system: urethral meatus normal and bladder without fullness, nontender Vaginal: normal without tenderness, induration or masses Cervix: normal appearance Adnexa: normal bimanual exam Uterus: anteverted and non-tender, normal size   Lab Review Urine pregnancy test Labs reviewed yes Radiologic studies reviewed yes  I have spent a total of 20 minutes of face-to-face time, excluding clinical staff time, reviewing notes and preparing to see patient, ordering tests and/or medications, and counseling the patient.    Assessment:    1. Encounter for gynecological examination with Papanicolaou smear of cervix (Primary) Rx: - Cytology - PAP( [facility]) - Cervicovaginal ancillary only( [facility])  2. Fibroids, intramural  3. Pelvic pain Rx: - ibuprofen  (ADVIL ) 800 MG tablet; Take 1 tablet (800 mg total) by mouth every 8 (eight) hours as needed.  Dispense: 30 tablet; Refill: 5  4. Iron  deficiency anemia due to chronic blood loss Rx: - Prenatal Vit-Iron  Carbonyl-FA (PNV TABS 29-1) 29-1 MG TABS; Take 1 tablet by mouth daily before breakfast.  Dispense: 90 tablet; Refill: 3 - Ferric Maltol (ACCRUFER) 30 MG CAPS; Take 1 capsule (30 mg total) by mouth 2 (two) times daily before a meal. Take 2 hrs before, or 2 hrs after a meal.  Dispense: 60 capsule; Refill: 3  5. Obesity (BMI 35.0-39.9 without comorbidity) - weight reduction with the aid of dietary changes, exercise and behavioral modification encouraged      Plan:    Education reviewed: calcium supplements, depression evaluation, low fat, low cholesterol diet, safe sex/STD prevention, self breast exams, and weight bearing exercise. Follow up in: 6 weeks.   Meds ordered this encounter  Medications   ibuprofen  (ADVIL ) 800 MG tablet    Sig: Take 1 tablet (800 mg total) by mouth every 8 (eight) hours as needed.    Dispense:  30 tablet    Refill:  5   Prenatal Vit-Iron  Carbonyl-FA (PNV TABS 29-1) 29-1 MG TABS    Sig: Take 1 tablet by mouth daily before breakfast.    Dispense:  90 tablet    Refill:  3   Ferric Maltol (ACCRUFER) 30 MG CAPS    Sig: Take 1 capsule (30 mg total) by mouth 2 (two) times daily before a meal. Take 2 hrs before, or 2  hrs after a meal.    Dispense:  60 capsule    Refill:  3    CARLIN RONAL CENTERS, MD, FACOG Attending Obstetrician & Gynecologist, [facility] for [facility], [facility], [place] 01/21/2024

## 2024-01-21 NOTE — Progress Notes (Signed)
 Pt presents for pelvic pain and fibroids. Pt states that she is having a lot of pelvic pain, and heavy periods. Pt has been put on iron  by her pcp. No other questions or concerns at this time.

## 2024-01-22 LAB — CERVICOVAGINAL ANCILLARY ONLY
Bacterial Vaginitis (gardnerella): POSITIVE — AB
Candida Glabrata: NEGATIVE
Candida Vaginitis: NEGATIVE
Chlamydia: NEGATIVE
Comment: NEGATIVE
Comment: NEGATIVE
Comment: NEGATIVE
Comment: NEGATIVE
Comment: NEGATIVE
Comment: NORMAL
Neisseria Gonorrhea: NEGATIVE
Trichomonas: NEGATIVE

## 2024-01-23 ENCOUNTER — Ambulatory Visit: Payer: Self-pay | Admitting: Obstetrics

## 2024-01-23 DIAGNOSIS — B9689 Other specified bacterial agents as the cause of diseases classified elsewhere: Secondary | ICD-10-CM

## 2024-01-23 MED ORDER — METRONIDAZOLE 500 MG PO TABS
500.0000 mg | ORAL_TABLET | Freq: Two times a day (BID) | ORAL | 2 refills | Status: AC
Start: 2024-01-23 — End: ?

## 2024-01-26 LAB — CYTOLOGY - PAP
Adequacy: ABSENT
Comment: NEGATIVE
Comment: NEGATIVE
Comment: NEGATIVE
Diagnosis: NEGATIVE
HPV 16: POSITIVE — AB
HPV 18 / 45: NEGATIVE
High risk HPV: POSITIVE — AB

## 2024-02-08 ENCOUNTER — Other Ambulatory Visit: Payer: Self-pay | Admitting: Obstetrics

## 2024-02-08 DIAGNOSIS — D508 Other iron deficiency anemias: Secondary | ICD-10-CM

## 2024-02-08 MED ORDER — PNV-DHA+DOCUSATE 27-1.25-300 MG PO CAPS: 1.0000 | ORAL_CAPSULE | Freq: Every day | ORAL | 4 refills | Status: AC

## 2024-02-26 ENCOUNTER — Ambulatory Visit: Admitting: Obstetrics and Gynecology

## 2024-02-26 ENCOUNTER — Encounter: Payer: Self-pay | Admitting: Obstetrics and Gynecology

## 2024-02-26 VITALS — BP 136/85 | HR 68 | Ht 67.0 in | Wt 223.0 lb

## 2024-02-26 DIAGNOSIS — D219 Benign neoplasm of connective and other soft tissue, unspecified: Secondary | ICD-10-CM

## 2024-02-26 DIAGNOSIS — R87810 Cervical high risk human papillomavirus (HPV) DNA test positive: Secondary | ICD-10-CM

## 2024-02-26 DIAGNOSIS — N939 Abnormal uterine and vaginal bleeding, unspecified: Secondary | ICD-10-CM

## 2024-02-26 NOTE — Patient Instructions (Signed)
 Endometrial ablation   [place]  Uterine artery embolization  Myomectomy  hysterectomy

## 2024-02-26 NOTE — Progress Notes (Unsigned)
 Surgical consult d/t fibroids AUB.   BP today 132/91 and 136/85

## 2024-02-26 NOTE — Progress Notes (Unsigned)
 "   GYNECOLOGY VISIT  Patient name: ONEIDA MCKAMEY MRN [id number]  Date of birth: 14-May-1975 Chief Complaint:   No chief complaint on file.  History:  DAKISHA SCHOOF here to discuss AUB-L surgical management. Has been having heavy periods. Using the pills and periods have always been heavy, lasting 7 days. Having pain with th periods and using ibuprofen  and that has been helping. Bleeding was very heavy and just flowing. Has started po iron . No prior blood or iron  transfusion. Unsure when other family members underwent menopause. Has had a TL - no intentions of pregnancy.    The following portions of the patient's history were reviewed and updated as appropriate: allergies, current medications, past family history, past medical history, past social history, past surgical history and problem list.   Health Maintenance:   Last pap     Component Value Date/Time   DIAGPAP  01/21/2024 1033    - Negative for intraepithelial lesion or malignancy (NILM)   DIAGPAP (A) 09/09/2021 1558    - Atypical squamous cells of undetermined significance (ASC-US )   HPVHIGH Positive (A) 01/21/2024 1033   HPVHIGH Negative 09/09/2021 1558   ADEQPAP  01/21/2024 1033    Satisfactory for evaluation; transformation zone component ABSENT.   ADEQPAP  09/09/2021 1558    Satisfactory for evaluation; transformation zone component ABSENT.    Health Maintenance  Topic Date Due   Hepatitis B Vaccine (1 of 3 - 19+ 3-dose series) Never done   COVID-19 Vaccine (3 - 2025-26 season) 11/09/2023   Flu Shot  06/07/2024*   Breast Cancer Screening  11/17/2024   Colon Cancer Screening  10/23/2027   Pap with HPV screening  01/20/2029   DTaP/Tdap/Td vaccine (2 - Td or Tdap) 09/10/2031   Hepatitis C Screening  Completed   HIV Screening  Completed   Pneumococcal Vaccine  Aged Out   HPV Vaccine  Aged Out   Meningitis B Vaccine  Aged Out  *Topic was postponed. The date shown is not the original due date.       Review of Systems:  Pertinent items are noted in HPI. Comprehensive review of systems was otherwise negative.   Objective:  Physical Exam BP 136/85   Pulse 68   Ht 5' 7 (1.702 m)   Wt 223 lb (101.2 kg)   LMP 01/29/2024 (Exact Date)   BMI 34.93 kg/m    Physical Exam Vitals and nursing note reviewed.  Constitutional:      Appearance: Normal appearance.  HENT:     Head: Normocephalic and atraumatic.  Pulmonary:     Effort: Pulmonary effort is normal.  Skin:    General: Skin is warm and dry.  Neurological:     General: No focal deficit present.     Mental Status: She is alert.  Psychiatric:        Mood and Affect: Mood normal.        Behavior: Behavior normal.        Thought Content: Thought content normal.        Judgment: Judgment normal.      Labs and Imaging IMPRESSION: 1. 6.1 cm intramural to subserosal fibroid centered at the left uterine body. 2. Nonvisualization of either ovary. No adnexal mass or free fluid.     Assessment & Plan:  1. Abnormal uterine bleeding (AUB) (Primary) Additional labs for AUB workup. If po iron  not sufficient to treat anemia, may need IV iron . Discussed AUB management options. After discussion of options, elects for  [facility] referral. Concerned about down time/time out associated wth myomecotmy/hysterectomy. - CBC - HgB A1c - TSH Rfx on Abnormal to Free T4 - Ambulatory referral to Interventional Radiology  2. Fibroid *** - Ambulatory referral to Interventional Radiology  3. 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

## 2024-02-27 LAB — CBC
Hematocrit: 36.2 % (ref 34.0–46.6)
Hemoglobin: 10.4 g/dL — ABNORMAL LOW (ref 11.1–15.9)
MCH: 22.4 pg — ABNORMAL LOW (ref 26.6–33.0)
MCHC: 28.7 g/dL — ABNORMAL LOW (ref 31.5–35.7)
MCV: 78 fL — ABNORMAL LOW (ref 79–97)
Platelets: 241 x10E3/uL (ref 150–450)
RBC: 4.64 x10E6/uL (ref 3.77–5.28)
RDW: 17.5 % — ABNORMAL HIGH (ref 11.7–15.4)
WBC: 4.6 x10E3/uL (ref 3.4–10.8)

## 2024-02-27 LAB — HEMOGLOBIN A1C
Est. average glucose Bld gHb Est-mCnc: 120 mg/dL
Hgb A1c MFr Bld: 5.8 % — ABNORMAL HIGH (ref 4.8–5.6)

## 2024-02-27 LAB — TSH RFX ON ABNORMAL TO FREE T4: TSH: 0.596 u[IU]/mL (ref 0.450–4.500)

## 2024-02-29 ENCOUNTER — Ambulatory Visit: Payer: Self-pay | Admitting: Obstetrics and Gynecology

## 2024-04-04 ENCOUNTER — Encounter: Payer: Self-pay | Admitting: Obstetrics and Gynecology

## 2024-05-04 ENCOUNTER — Encounter: Admitting: Obstetrics & Gynecology
